# Patient Record
Sex: Male | Born: 1940 | Race: Black or African American | Hispanic: No | Smoking: Former smoker
Health system: Southern US, Community
[De-identification: ages and names within clinical notes are randomized; demographics above are authoritative.]

## PROBLEM LIST (undated history)

## (undated) DIAGNOSIS — I340 Nonrheumatic mitral (valve) insufficiency: Secondary | ICD-10-CM

## (undated) DIAGNOSIS — C787 Secondary malignant neoplasm of liver and intrahepatic bile duct: Secondary | ICD-10-CM

## (undated) DIAGNOSIS — M1990 Unspecified osteoarthritis, unspecified site: Secondary | ICD-10-CM

## (undated) DIAGNOSIS — T8859XA Other complications of anesthesia, initial encounter: Secondary | ICD-10-CM

## (undated) DIAGNOSIS — T4145XA Adverse effect of unspecified anesthetic, initial encounter: Secondary | ICD-10-CM

## (undated) DIAGNOSIS — E785 Hyperlipidemia, unspecified: Secondary | ICD-10-CM

## (undated) DIAGNOSIS — N183 Chronic kidney disease, stage 3 unspecified: Secondary | ICD-10-CM

## (undated) DIAGNOSIS — C189 Malignant neoplasm of colon, unspecified: Secondary | ICD-10-CM

## (undated) DIAGNOSIS — E119 Type 2 diabetes mellitus without complications: Secondary | ICD-10-CM

## (undated) DIAGNOSIS — I10 Essential (primary) hypertension: Secondary | ICD-10-CM

## (undated) DIAGNOSIS — D509 Iron deficiency anemia, unspecified: Secondary | ICD-10-CM

---

## 2015-06-11 ENCOUNTER — Inpatient Hospital Stay
Admission: EM | Admit: 2015-06-11 | Discharge: 2015-06-12 | DRG: 812 | Disposition: A | Discharge status: Home / Self Care | Payer: Medicare PPO | Attending: Internal Medicine | Admitting: Internal Medicine

## 2015-06-11 ENCOUNTER — Encounter: Payer: Self-pay | Admitting: Emergency Medicine

## 2015-06-11 DIAGNOSIS — N183 Chronic kidney disease, stage 3 unspecified: Secondary | ICD-10-CM | POA: Diagnosis present

## 2015-06-11 DIAGNOSIS — D509 Iron deficiency anemia, unspecified: Secondary | ICD-10-CM | POA: Diagnosis present

## 2015-06-11 DIAGNOSIS — I10 Essential (primary) hypertension: Secondary | ICD-10-CM | POA: Diagnosis present

## 2015-06-11 DIAGNOSIS — D649 Anemia, unspecified: Secondary | ICD-10-CM | POA: Diagnosis present

## 2015-06-11 DIAGNOSIS — E785 Hyperlipidemia, unspecified: Secondary | ICD-10-CM | POA: Diagnosis present

## 2015-06-11 DIAGNOSIS — F17220 Nicotine dependence, chewing tobacco, uncomplicated: Secondary | ICD-10-CM | POA: Diagnosis present

## 2015-06-11 DIAGNOSIS — Z79899 Other long term (current) drug therapy: Secondary | ICD-10-CM

## 2015-06-11 DIAGNOSIS — D72829 Elevated white blood cell count, unspecified: Secondary | ICD-10-CM | POA: Diagnosis present

## 2015-06-11 DIAGNOSIS — E119 Type 2 diabetes mellitus without complications: Secondary | ICD-10-CM

## 2015-06-11 DIAGNOSIS — R531 Weakness: Secondary | ICD-10-CM | POA: Diagnosis present

## 2015-06-11 HISTORY — DX: Essential (primary) hypertension: I10

## 2015-06-11 HISTORY — DX: Type 2 diabetes mellitus without complications: E11.9

## 2015-06-11 LAB — TSH: TSH: 2.134 u[IU]/mL (ref 0.350–4.500)

## 2015-06-11 LAB — COMPREHENSIVE METABOLIC PANEL
ALT: 24 U/L (ref 17–63)
AST: 29 U/L (ref 15–41)
Albumin: 3.5 g/dL (ref 3.5–5.0)
Alkaline Phosphatase: 71 U/L (ref 38–126)
Anion gap: 8 (ref 5–15)
BUN: 17 mg/dL (ref 6–20)
CHLORIDE: 106 mmol/L (ref 101–111)
CO2: 24 mmol/L (ref 22–32)
Calcium: 8.9 mg/dL (ref 8.9–10.3)
Creatinine, Ser: 1.52 mg/dL — ABNORMAL HIGH (ref 0.61–1.24)
GFR calc non Af Amer: 43 mL/min — ABNORMAL LOW (ref >60)
GFR, EST AFRICAN AMERICAN: 50 mL/min — AB (ref >60)
GLUCOSE: 227 mg/dL — AB (ref 65–99)
Potassium: 3.8 mmol/L (ref 3.5–5.1)
SODIUM: 138 mmol/L (ref 135–145)
Total Bilirubin: 0.4 mg/dL (ref 0.3–1.2)
Total Protein: 7.1 g/dL (ref 6.5–8.1)

## 2015-06-11 LAB — CBC
HCT: 19.2 % — ABNORMAL LOW (ref 40.0–52.0)
HEMOGLOBIN: 5.6 g/dL — AB (ref 13.0–18.0)
MCH: 17.8 pg — ABNORMAL LOW (ref 26.0–34.0)
MCHC: 29.4 g/dL — ABNORMAL LOW (ref 32.0–36.0)
MCV: 60.4 fL — ABNORMAL LOW (ref 80.0–100.0)
Platelets: 304 10*3/uL (ref 150–440)
RBC: 3.18 MIL/uL — ABNORMAL LOW (ref 4.40–5.90)
RDW: 17.5 % — ABNORMAL HIGH (ref 11.5–14.5)
WBC: 13.5 10*3/uL — ABNORMAL HIGH (ref 3.8–10.6)

## 2015-06-11 LAB — VITAMIN B12: VITAMIN B 12: 453 pg/mL (ref 180–914)

## 2015-06-11 LAB — GLUCOSE, CAPILLARY
GLUCOSE-CAPILLARY: 153 mg/dL — AB (ref 65–99)
Glucose-Capillary: 161 mg/dL — ABNORMAL HIGH (ref 65–99)

## 2015-06-11 LAB — RETICULOCYTES
RBC.: 3.24 MIL/uL — ABNORMAL LOW (ref 4.40–5.90)
Retic Count, Absolute: 58.3 10*3/uL (ref 19.0–183.0)
Retic Ct Pct: 1.8 % (ref 0.4–3.1)

## 2015-06-11 LAB — ABO/RH: ABO/RH(D): A NEG

## 2015-06-11 LAB — IRON AND TIBC
Iron: 9 ug/dL — ABNORMAL LOW (ref 45–182)
Saturation Ratios: 2 % — ABNORMAL LOW (ref 17.9–39.5)
TIBC: 532 ug/dL — AB (ref 250–450)
UIBC: 523 ug/dL

## 2015-06-11 LAB — FERRITIN: Ferritin: 4 ng/mL — ABNORMAL LOW (ref 24–336)

## 2015-06-11 LAB — FOLATE: Folate: 34 ng/mL (ref >5.9)

## 2015-06-11 LAB — PREPARE RBC (CROSSMATCH)

## 2015-06-11 MED ORDER — INSULIN ASPART 100 UNIT/ML ~~LOC~~ SOLN
0.0000 [IU] | Freq: Three times a day (TID) | SUBCUTANEOUS | Status: DC
  Administered 2015-06-12: 1 [IU] via SUBCUTANEOUS
  Filled 2015-06-11: qty 1

## 2015-06-11 MED ORDER — ACETAMINOPHEN 325 MG PO TABS: 650.0000 mg | ORAL_TABLET | Freq: Four times a day (QID) | ORAL | Status: DC | PRN

## 2015-06-11 MED ORDER — ACETAMINOPHEN 650 MG RE SUPP: 650.0000 mg | Freq: Four times a day (QID) | RECTAL | Status: DC | PRN

## 2015-06-11 MED ORDER — NIFEDIPINE ER OSMOTIC RELEASE 90 MG PO TB24
90.0000 mg | ORAL_TABLET | Freq: Every day | ORAL | Status: DC
  Filled 2015-06-11 (×2): qty 1

## 2015-06-11 MED ORDER — PRAVASTATIN SODIUM 20 MG PO TABS: 40.0000 mg | ORAL_TABLET | Freq: Every day | ORAL | Status: DC

## 2015-06-11 MED ORDER — QUINAPRIL HCL 10 MG PO TABS
20.0000 mg | ORAL_TABLET | Freq: Every day | ORAL | Status: DC
  Filled 2015-06-11 (×2): qty 2

## 2015-06-11 MED ORDER — INSULIN ASPART 100 UNIT/ML ~~LOC~~ SOLN: 0.0000 [IU] | Freq: Every day | SUBCUTANEOUS | Status: DC

## 2015-06-11 MED ORDER — PANTOPRAZOLE SODIUM 40 MG IV SOLR
40.0000 mg | Freq: Two times a day (BID) | INTRAVENOUS | Status: DC
  Administered 2015-06-11: 40 mg via INTRAVENOUS
  Filled 2015-06-11: qty 40

## 2015-06-11 MED ORDER — SODIUM CHLORIDE 0.9 % IV SOLN
Freq: Once | INTRAVENOUS | Status: AC
  Administered 2015-06-11 – 2015-06-12 (×2): via INTRAVENOUS

## 2015-06-11 NOTE — ED Notes (Signed)
MD at bedside. 

## 2015-06-11 NOTE — H&P (Signed)
[facility] [place] NAME: Aaron Allen    MR#:  [id number]  DATE OF BIRTH:  1941-02-22  DATE OF ADMISSION:  06/11/2015  PRIMARY CARE PHYSICIAN: No primary care provider on file.   REQUESTING/REFERRING PHYSICIAN: Dr. Jacqualine Code   CHIEF COMPLAINT:   Chief Complaint  Patient presents with  . Abnormal Lab    HISTORY OF PRESENT ILLNESS:  Aaron Allen  is a 74 y.o. male with a known history of hypertension, diabetes, chronic iron deficiency anemia with a baseline hemoglobin of 11 and February 2016 presents after going in for routine lab work showing hemoglobin of 5.5. He states that he has been fatigued for about 2-3 years, recently slightly more short of breath than usual with activity such as mowing the lawn which he did yesterday. He denies any melanoma, hematochezia or vomiting. He states that he does have indigestion particularly after eating spicy foods. He denies easy bruising or bleeding. No chest pain, presyncope, palpitations. He does have occasional bilateral lower extremity edema after standing for long periods of time. He has been told that he was anemic in the past by his primary care provider and was started on iron supplementation which worked well to increase his hemoglobin from 8.3 in October 2015 to 11.0 in February 2016. When his prescription iron ran out he was directed to take over-the-counter iron, but he did not tolerate it due to stomach upset and he has not taken any iron supplements for about 4 months. He is being admitted for symptomatic anemia.  PAST MEDICAL HISTORY:   Past Medical History  Diagnosis Date  . Diabetes mellitus without complication   . Hypertension   . Anemia     PAST SURGICAL HISTORY:  History reviewed. No pertinent past surgical history.  SOCIAL HISTORY:   History  Substance Use Topics  . Smoking status: Never Smoker   . Smokeless tobacco: Current User    Types: Chew  . Alcohol Use: No     FAMILY HISTORY:   Family History  Problem Relation Age of Onset  . Stroke Father   . Lung cancer Father     DRUG ALLERGIES:  No Known Allergies  REVIEW OF SYSTEMS:   Review of Systems  Constitutional: Positive for malaise/fatigue. Negative for fever, chills, weight loss and diaphoresis.  HENT: Negative for congestion, hearing loss and sore throat.   Eyes: Negative for blurred vision and pain.  Respiratory: Positive for shortness of breath. Negative for cough, hemoptysis, sputum production, wheezing and stridor.   Cardiovascular: Positive for leg swelling. Negative for chest pain, palpitations and orthopnea.  Gastrointestinal: Positive for heartburn. Negative for nausea, vomiting, abdominal pain, diarrhea, constipation, blood in stool and melena.  Genitourinary: Negative for dysuria and frequency.  Musculoskeletal: Negative for myalgias, back pain, joint pain and neck pain.  Skin: Negative for itching and rash.  Neurological: Positive for weakness. Negative for dizziness, focal weakness, loss of consciousness and headaches.  Endo/Heme/Allergies: Does not bruise/bleed easily.  Psychiatric/Behavioral: Negative for depression and hallucinations. The patient is not nervous/anxious.     MEDICATIONS AT HOME:   Prior to Admission medications   Medication Sig Start Date End Date Taking? Authorizing Provider  aspirin EC 81 MG tablet Take 81 mg by mouth daily.   Yes Historical Provider, MD  glipiZIDE (GLUCOTROL) 10 MG tablet Take 10 mg by mouth 2 (two) times daily before a meal.   Yes Historical Provider, MD  NIFEdipine (PROCARDIA XL/ADALAT-CC) 90 MG 24  hr tablet Take 90 mg by mouth daily.   Yes Historical Provider, MD  pravastatin (PRAVACHOL) 40 MG tablet Take 40 mg by mouth daily.   Yes Historical Provider, MD  quinapril (ACCUPRIL) 20 MG tablet Take 20 mg by mouth at bedtime.   Yes Historical Provider, MD      VITAL SIGNS:  Blood pressure 152/76, pulse 93, temperature 99 F  (37.2 C), temperature source Oral, resp. rate 18, height 5\' 9"  (1.753 m), weight 79.833 kg (176 lb), SpO2 100 %.  PHYSICAL EXAMINATION:  GENERAL:  73 y.o.-year-old patient lying in the bed with no acute distress.  EYES: Pupils equal, round, reactive to light and accommodation. No scleral icterus. Extraocular muscles intact.  HEENT: Head atraumatic, normocephalic. Oropharynx and nasopharynx clear. Oral mucous membranes pink and moist. Upper dentures in place. Lower teeth are present with fair dentition NECK:  Supple, no jugular venous distention. No thyroid enlargement, no tenderness.  LUNGS: Normal breath sounds bilaterally, no wheezing, rales, rhonchi or crepitation. No use of accessory muscles of respiration. Slightly dyspneic with rapid respirations CARDIOVASCULAR: S1, S2 normal. No murmurs, rubs, or gallops.  ABDOMEN: Soft, nontender, nondistended. Bowel sounds present. No organomegaly or mass. No guarding no rebound EXTREMITIES: No pedal edema, cyanosis, or clubbing. Referral pulses 2+ NEUROLOGIC: Cranial nerves II through XII are intact. Muscle strength 5/5 in all extremities. Sensation intact. Gait not checked.  PSYCHIATRIC: [name]. calm, appropriate SKIN: No obvious rash, lesion, or ulcer.   LABORATORY PANEL:   CBC  Recent Labs Lab 06/11/15 1531  WBC 13.5*  HGB 5.6*  HCT 19.2*  PLT 304   ------------------------------------------------------------------------------------------------------------------  Chemistries   Recent Labs Lab 06/11/15 1531  NA 138  K 3.8  CL 106  CO2 24  GLUCOSE 227*  BUN 17  CREATININE 1.52*  CALCIUM 8.9  AST 29  ALT 24  ALKPHOS 71  BILITOT 0.4   ------------------------------------------------------------------------------------------------------------------  Cardiac Enzymes No results for input(s): TROPONINI in the last 168  hours. ------------------------------------------------------------------------------------------------------------------  RADIOLOGY:  No results found.  EKG:  No orders found for this or any previous visit.  IMPRESSION AND PLAN:   Active Problems:   Anemia   Diabetes mellitus type 2 in nonobese   Hypertension   #1 symptomatic anemia: He does report shortness of breath and fatigue and is taking rapid respirations at this time. No chest pain or syncope. I have ordered a transfusion of 2 units packed red blood cells. We'll recheck CBC after transfusion is complete. Does not seem to have any overt GI bleeding, but does report indigestion with spicy foods. I will start him on a PPI and consult gastroenterology. As he is not actively bleeding it may be possible to discharge him and pursue EGD and colonoscopy as an outpatient. Check TSH and anemia panel.  #2 diabetes mellitus type 2: We'll check a hemoglobin A1c and start sliding scale insulin. Hold his oral glycemic agents for now.  #3 hypertension: Continue quinapril and nifedipine.  #4 hyperlipidemia: Continue pravastatin  Prophylaxis: Will be on a PPI, hold off on pharmacological DVT prophylaxis as he is very anemic and may possibly have bleeding  All the records are reviewed and case discussed with ED provider. Management plans discussed with the patient, family and they are in agreement.  CODE STATUS: Full   TOTAL TIME TAKING CARE OF THIS PATIENT: 50 minutes. 50% of time spent in care coordination and counseling. Discussed care plan with the patient and his 2  daughters at the bedside.   Myrtis Ser M.D on 06/11/2015 at 5:33 PM  Between 7am to 6pm - Pager - [phone]  After 6pm go to [email or web address] - password [email or web address] [facility]  [facility]  Office  [phone]  CC: Primary care physician; No primary care provider on file.

## 2015-06-11 NOTE — ED Provider Notes (Signed)
[name] Emergency Department Provider Note  ____________________________________________  Time seen: Approximately 4:37 PM  I have reviewed the triage vital signs and the nursing notes.   HISTORY  Chief Complaint Abnormal Lab    HPI Aaron Allen is a 74 y.o. male resents after receiving low blood count this morning while having labs drawn for an upcoming primary appointment. States for about the last 2 weeks he has been feeling very fatigued especially with walking. Yesterday he reports while out in the lawn after walking he felt very lightheadedness though he was going to pass out.  No chest pain fevers chills or other concerns. He has not seen any black or bloody stools. No nausea or vomiting. He reports he does have a history of sickle cell trait and iron deficiency, but has not been tolerant of his iron tablets.   Past Medical History  Diagnosis Date  . Diabetes mellitus without complication   . Hypertension     There are no active problems to display for this patient.   History reviewed. No pertinent past surgical history.  No current outpatient prescriptions on file.  Allergies Review of patient's allergies indicates no known allergies.  No family history on file.  Social History History  Substance Use Topics  . Smoking status: Never Smoker   . Smokeless tobacco: Current User    Types: Chew  . Alcohol Use: No    Review of Systems Constitutional: No fever/chills Eyes: No visual changes. ENT: No sore throat. Cardiovascular: Denies chest pain. Respiratory: Denies shortness of breath. Gastrointestinal: No abdominal pain.  No nausea, no vomiting.  No diarrhea.  No constipation. Genitourinary: Negative for dysuria. Musculoskeletal: Negative for back pain. Skin: Negative for rash. Neurological: Negative for headaches, focal weakness or numbness.  Fatigued, lightheaded at times especially after walking.  10-point ROS otherwise  negative.  ____________________________________________   PHYSICAL EXAM:  VITAL SIGNS: ED Triage Vitals  Enc Vitals Group     BP 06/11/15 1522 140/61 mmHg     Pulse Rate 06/11/15 1522 99     Resp 06/11/15 1522 18     Temp 06/11/15 1522 99 F (37.2 C)     Temp Source 06/11/15 1522 Oral     SpO2 06/11/15 1522 100 %     Weight 06/11/15 1522 176 lb (79.833 kg)     Height 06/11/15 1522 5\' 9"  (1.753 m)     Head Cir --      Peak Flow --      Pain Score 06/11/15 1525 0     Pain Loc --      Pain Edu? --      Excl. in [place]? --     Constitutional: Alert and oriented. Well appearing and in no acute distress. Eyes: Conjunctivae are normal. PERRL. EOMI. Head: Atraumatic. Nose: No congestion/rhinnorhea. Mouth/Throat: Mucous membranes are moist.  Oropharynx non-erythematous. Neck: No stridor.   Cardiovascular: Normal rate, regular rhythm. Grossly normal heart sounds.  Good peripheral circulation. Respiratory: Normal respiratory effort.  No retractions. Lungs CTAB. Gastrointestinal: Soft and nontender. No distention. No abdominal bruits. No CVA tenderness. Genitourinary: [place] formed stool negative guaiac. Musculoskeletal: No lower extremity tenderness nor edema.  No joint effusions. Neurologic:  Normal speech and language. No gross focal neurologic deficits are appreciated. Speech is normal. No gait instability. Skin:  Skin is warm, dry and intact. No rash noted. Psychiatric: Mood and affect are normal. Speech and behavior are normal.  ____________________________________________   LABS (all labs ordered are  listed, but only abnormal results are displayed)  Labs Reviewed  CBC - Abnormal; Notable for the following:    WBC 13.5 (*)    RBC 3.18 (*)    Hemoglobin 5.6 (*)    HCT 19.2 (*)    MCV 60.4 (*)    MCH 17.8 (*)    MCHC 29.4 (*)    RDW 17.5 (*)    All other components within normal limits  COMPREHENSIVE METABOLIC PANEL - Abnormal; Notable for the following:    Glucose, Bld  227 (*)    Creatinine, Ser 1.52 (*)    GFR calc non Af Amer 43 (*)    GFR calc Af Amer 50 (*)    All other components within normal limits   ____________________________________________  EKG   ____________________________________________  RADIOLOGY   ____________________________________________   PROCEDURES  Procedure(s) performed: None  Critical Care performed: No  ____________________________________________   INITIAL IMPRESSION / ASSESSMENT AND PLAN / ED COURSE  Pertinent labs & imaging results that were available during my care of the patient were reviewed by me and considered in my medical decision making (see chart for details).  Patient presents with fatigue and diagnosis of anemia from previous labs. Today the patient does report having fatigue especially exertional, and a near-syncopal episode yesterday. He is minimally stable. Of note his MCV is 60 significant indication of microcytic anemia. He has not been part of his iron tablets, and today's hemoglobin is less than 7. He has no other associated factors such as acute cardiac or pulmonary symptoms. Discussed with the patient as family, and I recommend consideration for admission and blood transfusion based on symptomatic anemia.  Patient is agreeable to plan. I defer to the hospitalist for ordering of blood products and admission. The present time there is no indication opinion needs emergent blood in the emergency room, rather type-specific blood would be of benefit ____________________________________________   FINAL CLINICAL IMPRESSION(S) / ED DIAGNOSES  Final diagnoses:  Symptomatic anemia      Delman Kitten, MD 06/11/15 1640

## 2015-06-11 NOTE — Progress Notes (Signed)
Admitted from ed this pm with  Anemia. A/o. No resp distress.  To receive 2 units of blood.

## 2015-06-11 NOTE — ED Notes (Signed)
Pt ambulatory to triage with abdominal labs; Pt sent here by PCP for low hemoglobin. Pt denies any shortness of breath, reports some fatigue. Pt reports unable to take iron pills due to upset stomach, Pt denies any abnormal stool color.

## 2015-06-11 NOTE — Plan of Care (Signed)
Problem: Discharge Progression Outcomes Goal: Barriers To Progression Addressed/Resolved Outcome: Not Progressing hgb low  To receive 2 units packed cells Goal: Discharge plan in place and appropriate Outcome: Progressing Plan at least 2 midnights per md. Goal: Pain controlled with appropriate interventions Outcome: Progressing Denies pain c/o Goal: Hemodynamically stable Outcome: Not Progressing See above Goal: Tolerating diet Outcome: Not Applicable Date Met:  54/88/30 No problems with eating or drinking Goal: Activity appropriate for discharge plan Outcome: Progressing Pt amb w/o assist at home. Encouraged to call for assist to be oob. Urinal at bedside Goal: Other Discharge Outcomes/Goals Outcome: Progressing Lives by self. Good family support

## 2015-06-12 DIAGNOSIS — D72829 Elevated white blood cell count, unspecified: Secondary | ICD-10-CM

## 2015-06-12 DIAGNOSIS — D509 Iron deficiency anemia, unspecified: Secondary | ICD-10-CM

## 2015-06-12 DIAGNOSIS — R531 Weakness: Secondary | ICD-10-CM

## 2015-06-12 LAB — HEMOGLOBIN: Hemoglobin: 7.6 g/dL — ABNORMAL LOW (ref 13.0–18.0)

## 2015-06-12 LAB — CBC
HCT: 22.6 % — ABNORMAL LOW (ref 40.0–52.0)
Hemoglobin: 7.1 g/dL — ABNORMAL LOW (ref 13.0–18.0)
MCH: 20.1 pg — ABNORMAL LOW (ref 26.0–34.0)
MCHC: 31.2 g/dL — AB (ref 32.0–36.0)
MCV: 64.5 fL — ABNORMAL LOW (ref 80.0–100.0)
Platelets: 255 10*3/uL (ref 150–440)
RBC: 3.51 MIL/uL — AB (ref 4.40–5.90)
RDW: 22.2 % — AB (ref 11.5–14.5)
WBC: 13.4 10*3/uL — AB (ref 3.8–10.6)

## 2015-06-12 LAB — BASIC METABOLIC PANEL
Anion gap: 5 (ref 5–15)
BUN: 14 mg/dL (ref 6–20)
CO2: 23 mmol/L (ref 22–32)
Calcium: 8.7 mg/dL — ABNORMAL LOW (ref 8.9–10.3)
Chloride: 111 mmol/L (ref 101–111)
Creatinine, Ser: 1.25 mg/dL — ABNORMAL HIGH (ref 0.61–1.24)
GFR calc Af Amer: >60 mL/min (ref >60)
GFR, EST NON AFRICAN AMERICAN: 54 mL/min — AB (ref >60)
Glucose, Bld: 111 mg/dL — ABNORMAL HIGH (ref 65–99)
POTASSIUM: 3.8 mmol/L (ref 3.5–5.1)
Sodium: 139 mmol/L (ref 135–145)

## 2015-06-12 LAB — GLUCOSE, CAPILLARY
GLUCOSE-CAPILLARY: 123 mg/dL — AB (ref 65–99)
Glucose-Capillary: 125 mg/dL — ABNORMAL HIGH (ref 65–99)
Glucose-Capillary: 150 mg/dL — ABNORMAL HIGH (ref 65–99)

## 2015-06-12 MED ORDER — PANTOPRAZOLE SODIUM 40 MG PO TBEC: 40.0000 mg | DELAYED_RELEASE_TABLET | Freq: Two times a day (BID) | ORAL | Status: DC

## 2015-06-12 NOTE — Progress Notes (Signed)
[facility] at [place] NAME: Aaron Allen    MR#:  [id number]  DATE OF BIRTH:  Feb 16, 1941  SUBJECTIVE:  CHIEF COMPLAINT:   Chief Complaint  Patient presents with  . Abnormal Lab   Was sent to the hospital because of abnormal lab studies with hemoglobin level of 5.5. Admitted to feeling fatigued and weak as well as short of breath. Denies noticing any bleeding, but admits of having indigestion symptoms. He is awaiting for gastroenterology consultation and is nothing by mouth. Hemoglobin level is 7.1 after 2 units of packed red blood cell transfusion, feels comfortable Heart rate was reported in the 30s. However, upon further discussion with the lever shoe clerk. It appears that computer does not pick up. Heart rate accurately due to PVCs ROS  VITAL SIGNS: Blood pressure 124/69, pulse 87, temperature 97.9 F (36.6 C), temperature source Oral, resp. rate 19, height 5\' 9"  (1.753 m), weight 79.833 kg (176 lb), SpO2 100 %.  PHYSICAL EXAMINATION:   GENERAL:  74 y.o.-year-old patient lying in the bed with no acute distress.  EYES: Pupils equal, round, reactive to light and accommodation. No scleral icterus. Extraocular muscles intact.  HEENT: Head atraumatic, normocephalic. Oropharynx and nasopharynx clear.  NECK:  Supple, no jugular venous distention. No thyroid enlargement, no tenderness.  LUNGS: Normal breath sounds bilaterally, no wheezing, rales,rhonchi or crepitation. No use of accessory muscles of respiration.  CARDIOVASCULAR: S1, S2 normal. No murmurs, rubs, or gallops.  ABDOMEN: Soft, nontender, nondistended. Bowel sounds present. No organomegaly or mass.  EXTREMITIES: No pedal edema, cyanosis, or clubbing.  NEUROLOGIC: Cranial nerves II through XII are intact. Muscle strength 5/5 in all extremities. Sensation intact. Gait not checked.  PSYCHIATRIC: [name].  SKIN: No obvious rash, lesion, or ulcer.    ORDERS/RESULTS REVIEWED:   CBC  Recent Labs Lab 06/11/15 1531 06/12/15 0426  WBC 13.5* 13.4*  HGB 5.6* 7.1*  HCT 19.2* 22.6*  PLT 304 255  MCV 60.4* 64.5*  MCH 17.8* 20.1*  MCHC 29.4* 31.2*  RDW 17.5* 22.2*   ------------------------------------------------------------------------------------------------------------------  Chemistries   Recent Labs Lab 06/11/15 1531 06/12/15 0426  NA 138 139  K 3.8 3.8  CL 106 111  CO2 24 23  GLUCOSE 227* 111*  BUN 17 14  CREATININE 1.52* 1.25*  CALCIUM 8.9 8.7*  AST 29  --   ALT 24  --   ALKPHOS 71  --   BILITOT 0.4  --    ------------------------------------------------------------------------------------------------------------------ estimated creatinine clearance is 50.3 mL/min (by C-G formula based on Cr of 1.25). ------------------------------------------------------------------------------------------------------------------  Recent Labs  06/11/15 1531  TSH 2.134    Cardiac Enzymes No results for input(s): CKMB, TROPONINI, MYOGLOBIN in the last 168 hours.  Invalid input(s): CK ------------------------------------------------------------------------------------------------------------------ Invalid input(s): POCBNP ---------------------------------------------------------------------------------------------------------------  RADIOLOGY: No results found.  EKG: No orders found for this or any previous visit.  ASSESSMENT AND PLAN:  Active Problems:   Anemia   Diabetes mellitus type 2 in nonobese   Hypertension 1. Severe iron deficiency anemia, status post 2 units of packed red blood cell transfusion, hemoglobin level seemed to be improved. Patient will be started on iron supplementation upon discharge home. Patient will be seeing a gastroenterologist for further recommendations. Likely outpatient colonoscopy as well as EGD 2. Leukocytosis of unclear etiology, following along, possibly stress related 3.  Diabetes mellitus, continue outpatient medications. Patient's blood glucose levels ranging between 110-220 4. History of essential hypertension, continue outpatient  medications. Patient's blood pressure is well controlled 5. Generalized weakness. Get physical therapist evaluation. Patient may benefit from outpatient or home health physical therapy  Management plans discussed with the patient, family and they are in agreement.   DRUG ALLERGIES: No Known Allergies  CODE STATUS:     Code Status Orders        Start     Ordered   06/11/15 1748  Full code   Continuous     06/11/15 1747      TOTAL TIME TAKING CARE OF THIS PATIENT: 40 minutes.    Theodoro Grist M.D on 06/12/2015 at 1:34 PM  Between 7am to 6pm - Pager - [phone]  After 6pm go to [email or web address] - password [email or web address] [facility]  [facility]  Office  [phone]  CC: Primary care physician; No primary care provider on file.

## 2015-06-12 NOTE — Plan of Care (Signed)
Problem: Discharge Progression Outcomes Goal: Other Discharge Outcomes/Goals Outcome: Progressing Pt received 2 units PRBS this shift post transfusion HGB is 7.1, no complaints of pain, VSS, NPO after midnight per md order, awaiting GI consult

## 2015-06-12 NOTE — Progress Notes (Signed)
Discharge instructions given and went over with patient and daughter at bedside. All questions answered. Prescription in hand. Patient to be discharged home with daughter via wheelchair by nursing staff. Madlyn Frankel, RN

## 2015-06-12 NOTE — Consult Note (Signed)
GI Inpatient Consult Note  Reason for Consult:  Symptomatic IDA   Attending Requesting Consult: [place]  History of Present Illness: Aaron Allen is a 74 y.o. male with past medical medical history notable for diabetes, hypertension who is presenting for evaluation of symptomatic anemia. Aaron Allen reports over the past week he's had extreme weakness and fatigue and presented to his PCP. There he was found to have a hemoglobin of 5.6 and was sent to the emergency room for transfusion.  Denies any prior issues with anemia that he is aware of. He also denies any blood in his stool. He is not seen any bright red blood, maroon blood or any black or dark stools. He has not been having any weight loss, dysphagia, fevers chills.  He has never had an EGD or colonoscopy. There is no family history of GI malignancy. He is not take any blood thinners.  Here in the hospital, he received 2 units of packed red blood cells and his hemoglobin went from 5.6-7.6. He is currently stable. He is not had any bleeding in hospital.  Past Medical History:  Past Medical History  Diagnosis Date  . Diabetes mellitus without complication   . Hypertension   . Anemia     Problem List: Patient Active Problem List   Diagnosis Date Noted  . Anemia 06/11/2015  . Diabetes mellitus type 2 in nonobese 06/11/2015  . Hypertension 06/11/2015    Past Surgical History: History reviewed. No pertinent past surgical history.   Allergies: No Known Allergies  Home Medications: Prescriptions prior to admission  Medication Sig Dispense Refill Last Dose  . aspirin EC 81 MG tablet Take 81 mg by mouth daily.   06/11/2015 at Unknown time  . glipiZIDE (GLUCOTROL) 10 MG tablet Take 10 mg by mouth 2 (two) times daily before a meal.   06/10/2015 at Unknown time  . NIFEdipine (PROCARDIA XL/ADALAT-CC) 90 MG 24 hr tablet Take 90 mg by mouth daily.   06/11/2015 at Unknown time  . pravastatin (PRAVACHOL) 40 MG tablet Take 40 mg by mouth  daily.   06/11/2015 at Unknown time  . quinapril (ACCUPRIL) 20 MG tablet Take 20 mg by mouth at bedtime.   06/11/2015 at Unknown time   Home medication reconciliation was completed with the patient.   Scheduled Inpatient Medications:   . insulin aspart  0-5 Units Subcutaneous QHS  . insulin aspart  0-9 Units Subcutaneous TID WC  . pantoprazole (PROTONIX) IV  40 mg Intravenous Q12H  . pravastatin  40 mg Oral Daily  . quinapril  20 mg Oral QHS    Continuous Inpatient Infusions:     PRN Inpatient Medications:  acetaminophen **OR** acetaminophen  Family History: family history includes Lung cancer in his father; Stroke in his father.  The patient's family history is negative for inflammatory bowel disorders, GI malignancy, or solid organ transplantation.  Social History:   reports that he has never smoked. His smokeless tobacco use includes Chew. He reports that he does not drink alcohol or use illicit drugs.   Review of Systems: Constitutional: Weight is stable.  + severe fatigue Eyes: No changes in vision. ENT: No oral lesions, sore throat.  GI: see HPI.  Heme/Lymph: No easy bruising.  CV: No chest pain.  GU: No hematuria.  Integumentary: No rashes.  Neuro: No headaches.  Psych: No depression/anxiety.  Endocrine: No heat/cold intolerance.  Allergic/Immunologic: No urticaria.  Resp: No cough, SOB.  Musculoskeletal: No joint swelling.    Physical Examination:  BP 124/69 mmHg  Pulse 87  Temp(Src) 97.9 F (36.6 C) (Oral)  Resp 19  Ht 5\' 9"  (1.753 m)  Wt 79.833 kg (176 lb)  BMI 25.98 kg/m2  SpO2 100% Gen: NAD, alert and oriented x 4 HEENT: PEERLA, EOMI, Neck: supple, no JVD or thyromegaly Chest: CTA bilaterally, no wheezes, crackles, or other adventitious sounds CV: RRR, no m/g/c/r Abd: soft, NT, ND, +BS in all four quadrants; no HSM, guarding, ridigity, or rebound tenderness Ext: no edema, well perfused with 2+ pulses, Skin: no rash or lesions noted Lymph: no  LAD  Data: Lab Results  Component Value Date   WBC 13.4* 06/12/2015   HGB 7.6* 06/12/2015   HCT 22.6* 06/12/2015   MCV 64.5* 06/12/2015   PLT 255 06/12/2015    Recent Labs Lab 06/11/15 1531 06/12/15 0426 06/12/15 1554  HGB 5.6* 7.1* 7.6*   Lab Results  Component Value Date   NA 139 06/12/2015   K 3.8 06/12/2015   CL 111 06/12/2015   CO2 23 06/12/2015   BUN 14 06/12/2015   CREATININE 1.25* 06/12/2015   Lab Results  Component Value Date   ALT 24 06/11/2015   AST 29 06/11/2015   ALKPHOS 71 06/11/2015   BILITOT 0.4 06/11/2015   No results for input(s): APTT, INR, PTT in the last 168 hours.   Assessment/Plan: Aaron Allen is a 74 y.o. male presenting with a symptomatic iron deficiency anemia. He has not had any overt bleeding. He has never had EGD or colonoscopy. Would be willing to undergo these studies to evaluate for a source of blood loss. Currently his hemoglobin is stable after transfusion and I am okay with discharge with follow-up in clinic this week for scheduling of EGD and colonoscopy. Can hold off starting oral iron until after these procedures. We will also recheck his hemoglobin and clinic this week.  Recommendations: - clinic f/u this week - EGD and colon to eval for GI source of bleeding - recheck Hgb at clinic for stability  Thank you for the consult. Please call with questions or concerns.  Rubby Barbary, Grace Blight, MD

## 2015-06-12 NOTE — Discharge Summary (Addendum)
[place] [facility] NAME: Aaron Allen    MR#:  [id number]  DATE OF BIRTH:  November 28, 1941  DATE OF ADMISSION:  06/11/2015 ADMITTING PHYSICIAN: Aldean Jewett, MD  DATE OF DISCHARGE: 06/12/2015.  PRIMARY CARE PHYSICIAN: No primary care provider on file.     ADMISSION DIAGNOSIS:  Symptomatic anemia [D64.9]  DISCHARGE DIAGNOSIS:  Active Problems:   Anemia   Anemia, iron deficiency   Leukocytosis   Generalized weakness   Diabetes mellitus type 2 in nonobese   Hypertension   SECONDARY DIAGNOSIS:   Past Medical History  Diagnosis Date  . Diabetes mellitus without complication   . Hypertension   . Anemia     .pro HOSPITAL COURSE:   Mr. Aaron Allen is 74 year old male with history of hypertension, diabetes, chronic iron deficiency anemia who presents to the hospital after you are was found to have hemoglobin level of 5.5. Patient admitted that he has been fatigued for 2 or 3 years and recently became more short of breath and had generalized slowing,  Weakness, inability to do work, which she did in the past. Patient denied any bleeding episodes of bruising. He was admitted to the hospital for further evaluation. In emergency room, patient's hemoglobin level was found to be 5.6. He was transfused with 2 units of packed red blood cells after which his hemoglobin level improved to 7.6. He was noted not to have any acute bleeding while in the hospital. His iron level was found to be low at 9, total iron binding capacity was 532. Saturation was only 2 and ferritin level was only 4. Folic acid level as well as vitamin B-12 were within normal limits. Patient was seen by Dr. Rayann Heman,  gastroenterologist who recommended outpatient follow-up for EGD and colonoscopy. Dr. Rayann Heman felt that patient should hold off starting oral iron until these procedures are done.  Discussion by problem 1. Severe iron deficiency anemia, status post 2 units of packed red blood  cell transfusion, hemoglobin level  improved. Patient will be started on iron supplementation orally after EGD and colonoscopy is  done early next week by Dr. Rayann Heman  2. Leukocytosis of unclear etiology, following along, possibly stress related, follow-up as outpatient 3. Diabetes mellitus, continue outpatient medications. Patient's blood glucose levels ranging between 110-220 4. History of essential hypertension, continue outpatient medications. Patient's blood pressure is well controlled 5. Generalized weakness. Unfortunately was not able to get physical therapist evaluation. Patient still would benefit from home health physical therapy, which will be ordered for him upon discharge  DISCHARGE CONDITIONS:   Fair  CONSULTS OBTAINED:  Treatment Team:  Josefine Class, MD  DRUG ALLERGIES:  No Known Allergies  DISCHARGE MEDICATIONS:   Current Discharge Medication List    START taking these medications   Details  pantoprazole (PROTONIX) 40 MG tablet Take 1 tablet (40 mg total) by mouth 2 (two) times daily. Switch for any other PPI at similar dose and frequency Qty: 30 tablet, Refills: 3      CONTINUE these medications which have NOT CHANGED   Details  glipiZIDE (GLUCOTROL) 10 MG tablet Take 10 mg by mouth 2 (two) times daily before a meal.    NIFEdipine (PROCARDIA XL/ADALAT-CC) 90 MG 24 hr tablet Take 90 mg by mouth daily.    pravastatin (PRAVACHOL) 40 MG tablet Take 40 mg by mouth daily.    quinapril (ACCUPRIL) 20 MG tablet Take 20 mg by mouth at bedtime.  STOP taking these medications     aspirin EC 81 MG tablet          DISCHARGE INSTRUCTIONS:    Please follow up with Dr. Rayann Heman , gastroenterologist,  in next few days after discharge for EGD and colonoscopy  If you experience worsening of your admission symptoms, develop shortness of breath, life threatening emergency, suicidal or homicidal thoughts you must seek medical attention immediately by calling 911 or  calling your MD immediately  if symptoms less severe.  You Must read complete instructions/literature along with all the possible adverse reactions/side effects for all the Medicines you take and that have been prescribed to you. Take any new Medicines after you have completely understood and accept all the possible adverse reactions/side effects.   Please note  You were cared for by a hospitalist during your hospital stay. If you have any questions about your discharge medications or the care you received while you were in the hospital after you are discharged, you can call the unit and asked to speak with the hospitalist on call if the hospitalist that took care of you is not available. Once you are discharged, your primary care physician will handle any further medical issues. Please note that NO REFILLS for any discharge medications will be authorized once you are discharged, as it is imperative that you return to your primary care physician (or establish a relationship with a primary care physician if you do not have one) for your aftercare needs so that they can reassess your need for medications and monitor your lab values.    Today   CHIEF COMPLAINT:   Chief Complaint  Patient presents with  . Abnormal Lab    HISTORY OF PRESENT ILLNESS:  Aaron Allen  is a 74 y.o. male with a known history of hypertension, diabetes, chronic iron deficiency anemia who presents to the hospital after you are was found to have hemoglobin level of 5.5. Patient admitted that he has been fatigued for 2 or 3 years and recently became more short of breath and had generalized slowing,  Weakness, inability to do work, which she did in the past. Patient denied any bleeding episodes of bruising. He was admitted to the hospital for further evaluation. In emergency room, patient's hemoglobin level was found to be 5.6. He was transfused with 2 units of packed red blood cells after which his hemoglobin level improved to  7.6. He was noted not to have any acute bleeding while in the hospital. His iron level was found to be low at 9, total iron binding capacity was 532. Saturation was only 2 and ferritin level was only 4. Folic acid level as well as vitamin B-12 were within normal limits. Patient was seen by Dr. Rayann Heman,  gastroenterologist who recommended outpatient follow-up for EGD and colonoscopy. Dr. Rayann Heman felt that patient should hold off starting oral iron until these procedures are done.  Discussion by problem 1. Severe iron deficiency anemia, status post 2 units of packed red blood cell transfusion, hemoglobin level  improved. Patient will be started on iron supplementation orally after EGD and colonoscopy is  done early next week by Dr. Rayann Heman  2. Leukocytosis of unclear etiology, following along, possibly stress related, follow-up as outpatient 3. Diabetes mellitus, continue outpatient medications. Patient's blood glucose levels ranging between 110-220 4. History of essential hypertension, continue outpatient medications. Patient's blood pressure is well controlled 5. Generalized weakness. Unfortunately was not able to get physical therapist evaluation. Patient still would benefit from  home health physical therapy, which will be ordered for him upon discharge   VITAL SIGNS:  Blood pressure 124/69, pulse 87, temperature 97.9 F (36.6 C), temperature source Oral, resp. rate 19, height 5\' 9"  (1.753 m), weight 79.833 kg (176 lb), SpO2 100 %.  I/O:   Intake/Output Summary (Last 24 hours) at 06/12/15 1718 Last data filed at 06/12/15 1200  Gross per 24 hour  Intake   1338 ml  Output   1300 ml  Net     38 ml    PHYSICAL EXAMINATION:  GENERAL:  74 y.o.-year-old patient lying in the bed with no acute distress.  EYES: Pupils equal, round, reactive to light and accommodation. No scleral icterus. Extraocular muscles intact.  HEENT: Head atraumatic, normocephalic. Oropharynx and nasopharynx clear.  NECK:  Supple, no  jugular venous distention. No thyroid enlargement, no tenderness.  LUNGS: Normal breath sounds bilaterally, no wheezing, rales,rhonchi or crepitation. No use of accessory muscles of respiration.  CARDIOVASCULAR: S1, S2 normal. No murmurs, rubs, or gallops.  ABDOMEN: Soft, non-tender, non-distended. Bowel sounds present. No organomegaly or mass.  EXTREMITIES: No pedal edema, cyanosis, or clubbing.  NEUROLOGIC: Cranial nerves II through XII are intact. Muscle strength 5/5 in all extremities. Sensation intact. Gait not checked.  PSYCHIATRIC: [name].  SKIN: No obvious rash, lesion, or ulcer.   DATA REVIEW:   CBC  Recent Labs Lab 06/12/15 0426 06/12/15 1554  WBC 13.4*  --   HGB 7.1* 7.6*  HCT 22.6*  --   PLT 255  --     Chemistries   Recent Labs Lab 06/11/15 1531 06/12/15 0426  NA 138 139  K 3.8 3.8  CL 106 111  CO2 24 23  GLUCOSE 227* 111*  BUN 17 14  CREATININE 1.52* 1.25*  CALCIUM 8.9 8.7*  AST 29  --   ALT 24  --   ALKPHOS 71  --   BILITOT 0.4  --     Cardiac Enzymes No results for input(s): TROPONINI in the last 168 hours.  Microbiology Results  No results found for this or any previous visit.  RADIOLOGY:  No results found.  EKG:  No orders found for this or any previous visit.    Management plans discussed with the patient, family and they are in agreement.  CODE STATUS:     Code Status Orders        Start     Ordered   06/11/15 1748  Full code   Continuous     06/11/15 1747      TOTAL TIME TAKING CARE OF THIS PATIENT: 50 minutes.    Theodoro Grist M.D on 06/12/2015 at 5:18 PM  Between 7am to 6pm - Pager - [phone]  After 6pm go to [email or web address] - password [email or web address] [facility]  [facility]  Office  [phone]  CC: Primary care physician; No primary care provider on file.

## 2015-06-13 LAB — TYPE AND SCREEN
ABO/RH(D): A NEG
ANTIBODY SCREEN: NEGATIVE
Unit division: 0
Unit division: 0

## 2015-06-14 LAB — HEMOGLOBIN A1C: HEMOGLOBIN A1C: 7.5 % — AB (ref 4.0–6.0)

## 2015-06-29 ENCOUNTER — Inpatient Hospital Stay: Payer: Medicare PPO | Attending: Hematology and Oncology | Admitting: Hematology and Oncology

## 2015-07-09 ENCOUNTER — Telehealth: Payer: Self-pay | Admitting: *Deleted

## 2015-07-09 NOTE — Telephone Encounter (Signed)
Initial phone call to Triage from Jordan Hawks who states patient's daughter is on the phone, stating he was recently in the hospital for blood transfusion.  Hgb rechecked today and it is 6 something.  Patient has appt with Dr. Grayland Ormond next Friday.  Wants to know what to do?  Advised her to call [place] and see if Dr. Grayland Ormond can see patient earlier.  Reported back that appointment has been moved to Tuesday and that she had informed daughter. Also advisediIf patient has symptoms in the meantime, he should go to ED.

## 2015-07-12 ENCOUNTER — Observation Stay
Admission: EM | Admit: 2015-07-12 | Discharge: 2015-07-13 | Disposition: A | Discharge status: Home / Self Care | Payer: Medicare PPO | Attending: Internal Medicine | Admitting: Internal Medicine

## 2015-07-12 ENCOUNTER — Encounter: Payer: Self-pay | Admitting: Emergency Medicine

## 2015-07-12 DIAGNOSIS — I10 Essential (primary) hypertension: Secondary | ICD-10-CM | POA: Diagnosis not present

## 2015-07-12 DIAGNOSIS — E119 Type 2 diabetes mellitus without complications: Secondary | ICD-10-CM | POA: Diagnosis not present

## 2015-07-12 DIAGNOSIS — R0602 Shortness of breath: Secondary | ICD-10-CM | POA: Insufficient documentation

## 2015-07-12 DIAGNOSIS — D509 Iron deficiency anemia, unspecified: Secondary | ICD-10-CM | POA: Insufficient documentation

## 2015-07-12 DIAGNOSIS — E785 Hyperlipidemia, unspecified: Secondary | ICD-10-CM | POA: Diagnosis not present

## 2015-07-12 DIAGNOSIS — D649 Anemia, unspecified: Secondary | ICD-10-CM | POA: Diagnosis not present

## 2015-07-12 DIAGNOSIS — I959 Hypotension, unspecified: Secondary | ICD-10-CM | POA: Insufficient documentation

## 2015-07-12 DIAGNOSIS — Z79899 Other long term (current) drug therapy: Secondary | ICD-10-CM | POA: Insufficient documentation

## 2015-07-12 DIAGNOSIS — Z801 Family history of malignant neoplasm of trachea, bronchus and lung: Secondary | ICD-10-CM | POA: Insufficient documentation

## 2015-07-12 DIAGNOSIS — R609 Edema, unspecified: Secondary | ICD-10-CM

## 2015-07-12 DIAGNOSIS — E871 Hypo-osmolality and hyponatremia: Secondary | ICD-10-CM | POA: Diagnosis not present

## 2015-07-12 DIAGNOSIS — Z823 Family history of stroke: Secondary | ICD-10-CM | POA: Insufficient documentation

## 2015-07-12 HISTORY — DX: Hyperlipidemia, unspecified: E78.5

## 2015-07-12 LAB — COMPREHENSIVE METABOLIC PANEL
ALT: 17 U/L (ref 17–63)
ANION GAP: 10 (ref 5–15)
AST: 22 U/L (ref 15–41)
Albumin: 3 g/dL — ABNORMAL LOW (ref 3.5–5.0)
Alkaline Phosphatase: 75 U/L (ref 38–126)
BUN: 14 mg/dL (ref 6–20)
CALCIUM: 8.8 mg/dL — AB (ref 8.9–10.3)
CO2: 23 mmol/L (ref 22–32)
Chloride: 98 mmol/L — ABNORMAL LOW (ref 101–111)
Creatinine, Ser: 1.3 mg/dL — ABNORMAL HIGH (ref 0.61–1.24)
GFR calc Af Amer: 60 mL/min — ABNORMAL LOW (ref >60)
GFR calc non Af Amer: 52 mL/min — ABNORMAL LOW (ref >60)
GLUCOSE: 247 mg/dL — AB (ref 65–99)
POTASSIUM: 3 mmol/L — AB (ref 3.5–5.1)
SODIUM: 131 mmol/L — AB (ref 135–145)
Total Bilirubin: 0.4 mg/dL (ref 0.3–1.2)
Total Protein: 6.9 g/dL (ref 6.5–8.1)

## 2015-07-12 LAB — CBC WITH DIFFERENTIAL/PLATELET
BASOS ABS: 0 10*3/uL (ref 0–0.1)
BASOS PCT: 0 %
EOS ABS: 0 10*3/uL (ref 0–0.7)
EOS PCT: 0 %
HCT: 22.6 % — ABNORMAL LOW (ref 40.0–52.0)
Hemoglobin: 6.7 g/dL — ABNORMAL LOW (ref 13.0–18.0)
LYMPHS ABS: 0.7 10*3/uL — AB (ref 1.0–3.6)
Lymphocytes Relative: 5 %
MCH: 18.3 pg — ABNORMAL LOW (ref 26.0–34.0)
MCHC: 29.8 g/dL — ABNORMAL LOW (ref 32.0–36.0)
MCV: 61.3 fL — ABNORMAL LOW (ref 80.0–100.0)
MONO ABS: 1.1 10*3/uL — AB (ref 0.2–1.0)
Monocytes Relative: 9 %
Neutro Abs: 10.5 10*3/uL — ABNORMAL HIGH (ref 1.4–6.5)
Neutrophils Relative %: 86 %
PLATELETS: 325 10*3/uL (ref 150–440)
RBC: 3.69 MIL/uL — ABNORMAL LOW (ref 4.40–5.90)
RDW: 21.8 % — ABNORMAL HIGH (ref 11.5–14.5)
WBC: 12.3 10*3/uL — ABNORMAL HIGH (ref 3.8–10.6)

## 2015-07-12 LAB — GLUCOSE, CAPILLARY
GLUCOSE-CAPILLARY: 135 mg/dL — AB (ref 65–99)
GLUCOSE-CAPILLARY: 149 mg/dL — AB (ref 65–99)

## 2015-07-12 LAB — PREPARE RBC (CROSSMATCH)

## 2015-07-12 LAB — MAGNESIUM: MAGNESIUM: 1.9 mg/dL (ref 1.7–2.4)

## 2015-07-12 LAB — TROPONIN I: Troponin I: <0.03 ng/mL (ref <0.031)

## 2015-07-12 MED ORDER — GLIPIZIDE 5 MG PO TABS
10.0000 mg | ORAL_TABLET | Freq: Two times a day (BID) | ORAL | Status: DC
  Administered 2015-07-13: 10 mg via ORAL
  Filled 2015-07-12: qty 2

## 2015-07-12 MED ORDER — SODIUM CHLORIDE 0.9 % IV SOLN
INTRAVENOUS | Status: DC
  Administered 2015-07-12: 18:00:00 via INTRAVENOUS

## 2015-07-12 MED ORDER — ACETAMINOPHEN 650 MG RE SUPP: 650.0000 mg | Freq: Four times a day (QID) | RECTAL | Status: DC | PRN

## 2015-07-12 MED ORDER — ONDANSETRON HCL 4 MG/2ML IJ SOLN: 4.0000 mg | Freq: Four times a day (QID) | INTRAMUSCULAR | Status: DC | PRN

## 2015-07-12 MED ORDER — POTASSIUM CHLORIDE 20 MEQ/15ML (10%) PO SOLN
40.0000 meq | Freq: Once | ORAL | Status: AC
Start: 2015-07-12 — End: 2015-07-12
  Administered 2015-07-12: 40 meq via ORAL
  Filled 2015-07-12: qty 30

## 2015-07-12 MED ORDER — ONDANSETRON HCL 4 MG PO TABS: 4.0000 mg | ORAL_TABLET | Freq: Four times a day (QID) | ORAL | Status: DC | PRN

## 2015-07-12 MED ORDER — PANTOPRAZOLE SODIUM 40 MG PO TBEC
40.0000 mg | DELAYED_RELEASE_TABLET | Freq: Two times a day (BID) | ORAL | Status: DC
  Administered 2015-07-12 – 2015-07-13 (×2): 40 mg via ORAL
  Filled 2015-07-12 (×2): qty 1

## 2015-07-12 MED ORDER — SODIUM CHLORIDE 0.9 % IV SOLN
Freq: Once | INTRAVENOUS | Status: AC
  Administered 2015-07-12: via INTRAVENOUS

## 2015-07-12 MED ORDER — LISINOPRIL 20 MG PO TABS
20.0000 mg | ORAL_TABLET | Freq: Every day | ORAL | Status: DC
  Administered 2015-07-13: 20 mg via ORAL
  Filled 2015-07-12: qty 1

## 2015-07-12 MED ORDER — ACETAMINOPHEN 325 MG PO TABS
650.0000 mg | ORAL_TABLET | Freq: Once | ORAL | Status: AC
  Administered 2015-07-12: 650 mg via ORAL

## 2015-07-12 MED ORDER — HYDROCODONE-ACETAMINOPHEN 5-325 MG PO TABS: 1.0000 | ORAL_TABLET | ORAL | Status: DC | PRN

## 2015-07-12 MED ORDER — NIFEDIPINE ER OSMOTIC RELEASE 30 MG PO TB24
90.0000 mg | ORAL_TABLET | Freq: Every day | ORAL | Status: DC
  Administered 2015-07-13: 90 mg via ORAL
  Filled 2015-07-12: qty 3

## 2015-07-12 MED ORDER — PRAVASTATIN SODIUM 20 MG PO TABS
40.0000 mg | ORAL_TABLET | Freq: Every day | ORAL | Status: DC
Start: 2015-07-13 — End: 2015-07-13
  Administered 2015-07-13: 40 mg via ORAL
  Filled 2015-07-12: qty 2

## 2015-07-12 MED ORDER — INSULIN ASPART 100 UNIT/ML ~~LOC~~ SOLN
0.0000 [IU] | Freq: Three times a day (TID) | SUBCUTANEOUS | Status: DC
  Administered 2015-07-13: 1 [IU] via SUBCUTANEOUS
  Filled 2015-07-12: qty 1

## 2015-07-12 MED ORDER — QUINAPRIL HCL 10 MG PO TABS: 20.0000 mg | ORAL_TABLET | Freq: Every day | ORAL | Status: DC

## 2015-07-12 MED ORDER — SODIUM CHLORIDE 0.9 % IJ SOLN
3.0000 mL | Freq: Two times a day (BID) | INTRAMUSCULAR | Status: DC
  Administered 2015-07-13: 3 mL via INTRAVENOUS

## 2015-07-12 MED ORDER — ACETAMINOPHEN 325 MG PO TABS
650.0000 mg | ORAL_TABLET | Freq: Four times a day (QID) | ORAL | Status: DC | PRN
  Filled 2015-07-12: qty 2

## 2015-07-12 MED ORDER — ALUM & MAG HYDROXIDE-SIMETH 200-200-20 MG/5ML PO SUSP: 30.0000 mL | Freq: Four times a day (QID) | ORAL | Status: DC | PRN

## 2015-07-12 MED ORDER — INSULIN ASPART 100 UNIT/ML ~~LOC~~ SOLN: 0.0000 [IU] | Freq: Every day | SUBCUTANEOUS | Status: DC

## 2015-07-12 NOTE — ED Provider Notes (Signed)
[facility] Emergency Department Provider Note  ____________________________________________  Time seen: Approximately 2:43 PM  I have reviewed the triage vital signs and the nursing notes.   HISTORY  Chief Complaint Hypotension    HPI Aaron Allen is a 74 y.o. male persisted with some lightheadedness. Denies any other concerns or symptoms other than he believes he has low blood count. Does have history diabetes. Also history of severe iron deficiency.  No chest pain, no trouble breathing. No weakness numbness or tingling except for a feeling of generally weak in the legs for the last few days. His at work today and felt very lightheaded while standing.  Denies any black or bloody stools.  Modifying factors include worse with standing and exertion. Context of recent evaluation and need for previous transfusion due to anemia.   Past Medical History  Diagnosis Date  . Diabetes mellitus without complication   . Hypertension   . Anemia   . HLD (hyperlipidemia)     Patient Active Problem List   Diagnosis Date Noted  . Anemia, iron deficiency 06/12/2015  . Leukocytosis 06/12/2015  . Generalized weakness 06/12/2015  . Anemia 06/11/2015  . Diabetes mellitus type 2 in nonobese 06/11/2015  . Hypertension 06/11/2015    History reviewed. No pertinent past surgical history.  Current Outpatient Rx  Name  Route  Sig  Dispense  Refill  . glipiZIDE (GLUCOTROL) 10 MG tablet   Oral   Take 10 mg by mouth 2 (two) times daily.          Marland Kitchen NIFEdipine (PROCARDIA XL/ADALAT-CC) 90 MG 24 hr tablet   Oral   Take 90 mg by mouth daily.         . pantoprazole (PROTONIX) 40 MG tablet   Oral   Take 1 tablet (40 mg total) by mouth 2 (two) times daily. Switch for any other PPI at similar dose and frequency Patient taking differently: Take 40 mg by mouth 2 (two) times daily.    30 tablet   3   . pravastatin (PRAVACHOL) 40 MG tablet   Oral   Take 40 mg by  mouth at bedtime.          . quinapril (ACCUPRIL) 20 MG tablet   Oral   Take 40 mg by mouth daily.            Allergies Review of patient's allergies indicates no known allergies.  Family History  Problem Relation Age of Onset  . Stroke Father   . Lung cancer Father     Social History History  Substance Use Topics  . Smoking status: Never Smoker   . Smokeless tobacco: Current User    Types: Chew  . Alcohol Use: No    Review of Systems Constitutional: No fever/chills Eyes: No visual changes. ENT: No sore throat. Cardiovascular: Denies chest pain. Respiratory: Denies shortness of breath. Gastrointestinal: No abdominal pain.  No nausea, no vomiting.  No diarrhea.  No constipation. Genitourinary: Negative for dysuria. Musculoskeletal: Negative for back pain. Skin: Negative for rash. Neurological: Negative for headaches, focal weakness or numbness.  10-point ROS otherwise negative.  ____________________________________________   PHYSICAL EXAM:  VITAL SIGNS: ED Triage Vitals  Enc Vitals Group     BP 07/12/15 1238 147/69 mmHg     Pulse Rate 07/12/15 1238 94     Resp 07/12/15 1238 16     Temp 07/12/15 1238 98.9 F (37.2 C)     Temp Source 07/12/15 1238 Oral  SpO2 07/12/15 1238 97 %     Weight 07/12/15 1238 173 lb (78.472 kg)     Height 07/12/15 1238 5\' 9"  (1.753 m)     Head Cir --      Peak Flow --      Pain Score --      Pain Loc --      Pain Edu? --      Excl. in [place]? --     Constitutional: Alert and oriented. Well appearing and in no acute distress. Eyes: Conjunctivae are normal. PERRL. EOMI. Head: Atraumatic. Nose: No congestion/rhinnorhea. Mouth/Throat: Mucous membranes are moist.  Oropharynx non-erythematous. Neck: No stridor.   Cardiovascular: Normal rate, regular rhythm. Grossly normal heart sounds.  Good peripheral circulation. Respiratory: Normal respiratory effort.  No retractions. Lungs CTAB. Gastrointestinal: Soft and nontender. No  distention. No abdominal bruits. No CVA tenderness. Stool guaiac is negative for blood. Brown well-formed stool. Musculoskeletal: No lower extremity tenderness nor edema.  No joint effusions. Neurologic:  Normal speech and language. No gross focal neurologic deficits are appreciated. No gait instability. Skin:  Skin is warm, dry and intact. No rash noted. Psychiatric: Mood and affect are normal. Speech and behavior are normal.  ____________________________________________   LABS (all labs ordered are listed, but only abnormal results are displayed)  Labs Reviewed  CBC WITH DIFFERENTIAL/PLATELET - Abnormal; Notable for the following:    WBC 12.3 (*)    RBC 3.69 (*)    Hemoglobin 6.7 (*)    HCT 22.6 (*)    MCV 61.3 (*)    MCH 18.3 (*)    MCHC 29.8 (*)    RDW 21.8 (*)    Neutro Abs 10.5 (*)    Lymphs Abs 0.7 (*)    Monocytes Absolute 1.1 (*)    All other components within normal limits  COMPREHENSIVE METABOLIC PANEL - Abnormal; Notable for the following:    Sodium 131 (*)    Potassium 3.0 (*)    Chloride 98 (*)    Glucose, Bld 247 (*)    Creatinine, Ser 1.30 (*)    Calcium 8.8 (*)    Albumin 3.0 (*)    GFR calc non Af Amer 52 (*)    GFR calc Af Amer 60 (*)    All other components within normal limits  TROPONIN I   ____________________________________________  EKG   ____________________________________________  RADIOLOGY   ____________________________________________   PROCEDURES  Procedure(s) performed: None  Critical Care performed: No  ____________________________________________   INITIAL IMPRESSION / ASSESSMENT AND PLAN / ED COURSE  Pertinent labs & imaging results that were available during my care of the patient were reviewed by me and considered in my medical decision making (see chart for details).  Patient represents with symptomatic anemia. Discussed the patient, we will admit him to the hospital for transfusion and further evaluation by the  internal medicine service. Stable in the ER. ____________________________________________   FINAL CLINICAL IMPRESSION(S) / ED DIAGNOSES  Final diagnoses:  Symptomatic anemia      Delman Kitten, MD 07/12/15 [phone]

## 2015-07-12 NOTE — ED Notes (Signed)
Reports feeling weak.  staets his MD sent him here for low iron.  Skin w/d, NAD.

## 2015-07-12 NOTE — H&P (Addendum)
[facility] [place] NAME: Aaron Allen    MR#:  [id number]  DATE OF BIRTH:  [birth year]  DATE OF ADMISSION:  07/12/2015  PRIMARY CARE PHYSICIAN: Dr. Danise Edge  REQUESTING/REFERRING PHYSICIAN: Dr. Jacqualine Code  CHIEF COMPLAINT:  Weakness some from PCP office with low hemoglobin HISTORY OF PRESENT ILLNESS:  Aaron Allen  is a 74 y.o. male with a known history of iron deficiency anemia, HLD,diabetes and essential hypertension who presents above complaint. Patient was sent from his PCP office with low hemoglobin. Patient reports that he has had weakness. Patient denies melanoma, hematochezia or hematemesis. Patient was recently hospitalized and received 2 units of PRBCs. His iron panel was consistent with iron deficiency anemia. Patient was previously on iron supplementations however due to abdominal discomfort and pain he stopped this. He was seen by GI and referred as an outpatient for further evaluation and management. He is scheduled for EGD/colonoscopy on August 15. He is also scheduled for iron transfusion tomorrow at [facility] at 1:00. Patient is being admitted for iron transfusion.  PAST MEDICAL HISTORY:   Past Medical History  Diagnosis Date  . Diabetes mellitus without complication   . Hypertension   . Anemia    Hyperlipidemia  PAST SURGICAL HISTORY:  No surgical history  SOCIAL HISTORY:   History  Substance Use Topics  . Smoking status: Never Smoker   . Smokeless tobacco: Current User    Types: Chew  . Alcohol Use: No    FAMILY HISTORY:   Family History  Problem Relation Age of Onset  . Stroke Father   . Lung cancer Father     DRUG ALLERGIES:  No Known Allergies   REVIEW OF SYSTEMS:  CONSTITUTIONAL: No fever, positive fatigue and weakness.  EYES: No blurred or double vision.  EARS, NOSE, AND THROAT: No tinnitus or ear pain.  RESPIRATORY: No cough, shortness of breath, wheezing or hemoptysis.  CARDIOVASCULAR: No  chest pain, orthopnea, positive pitting edema edema.  GASTROINTESTINAL: No nausea, vomiting, diarrhea or abdominal pain.  GENITOURINARY: No dysuria, hematuria.  ENDOCRINE: No polyuria, nocturia,  HEMATOLOGY: Positive anemia, no easy bruising or bleeding SKIN: No rash or lesion. MUSCULOSKELETAL: No joint pain, positive arthritis.   NEUROLOGIC: No tingling, numbness, weakness.  PSYCHIATRY: No anxiety or depression.   MEDICATIONS AT HOME:   Prior to Admission medications   Medication Sig Start Date End Date Taking? Authorizing Provider  glipiZIDE (GLUCOTROL) 10 MG tablet Take 10 mg by mouth 2 (two) times daily before a meal.    Historical Provider, MD  NIFEdipine (PROCARDIA XL/ADALAT-CC) 90 MG 24 hr tablet Take 90 mg by mouth daily.    Historical Provider, MD  pantoprazole (PROTONIX) 40 MG tablet Take 1 tablet (40 mg total) by mouth 2 (two) times daily. Switch for any other PPI at similar dose and frequency 06/12/15   Theodoro Grist, MD  pravastatin (PRAVACHOL) 40 MG tablet Take 40 mg by mouth daily.    Historical Provider, MD  quinapril (ACCUPRIL) 20 MG tablet Take 20 mg by mouth at bedtime.    Historical Provider, MD      VITAL SIGNS:  Blood pressure 147/69, pulse 94, temperature 98.9 F (37.2 C), temperature source Oral, resp. rate 16, height 5\' 9"  (1.753 m), weight 78.472 kg (173 lb), SpO2 97 %.  PHYSICAL EXAMINATION:  GENERAL:  74 y.o.-year-old patient lying in the bed with no acute distress.  EYES: Pupils equal, round, reactive to light and accommodation. No scleral icterus. Extraocular  muscles intact.  HEENT: Head atraumatic, normocephalic. Oropharynx and nasopharynx clear.  NECK:  Supple, no jugular venous distention. No thyroid enlargement, no tenderness.  LUNGS: Normal breath sounds bilaterally, no wheezing, rales,rhonchi or crepitation. No use of accessory muscles of respiration.  CARDIOVASCULAR: S1, S2 normal. No murmurs, rubs, or gallops.  ABDOMEN: Soft, nontender,  nondistended. Bowel sounds present. No organomegaly or mass.  EXTREMITIES: ++ pedal edema, NO cyanosis or clubbing.  NEUROLOGIC: Cranial nerves II through XII are grossly intact. No focal deficits. PSYCHIATRIC: [name].  SKIN: No obvious rash, lesion, or ulcer.   LABORATORY PANEL:   CBC  Recent Labs Lab 07/12/15 1315  WBC 12.3*  HGB 6.7*  HCT 22.6*  PLT 325   ------------------------------------------------------------------------------------------------------------------  Chemistries   Recent Labs Lab 07/12/15 1315  NA 131*  K 3.0*  CL 98*  CO2 23  GLUCOSE 247*  BUN 14  CREATININE 1.30*  CALCIUM 8.8*  AST 22  ALT 17  ALKPHOS 75  BILITOT 0.4   ------------------------------------------------------------------------------------------------------------------  Cardiac Enzymes  Recent Labs Lab 07/12/15 1315  TROPONINI <0.03   ------------------------------------------------------------------------------------------------------------------  RADIOLOGY:  No results found.  EKG:    IMPRESSION AND PLAN:  74 year old male with a history of iron deficiency anemia, hypertension and diabetes who presents from his PCP office with symptomatic anemia.  1. Symptomatic anemia: This is iron deficiency acute on chronic. Patient has scheduled iron transfusion for tomorrow and a EGD and colonoscopy on August 15. Patient would like to keep these appointments. Patient would like to be transfused 1 unit of blood and if vitals are stable and hemoglobin is appropriately elevated he would like to be discharged in the a.m. I feel that this is a reasonable plan. He is intolerable to iron supplements.  2. Hyponatremia: Likely secondary to hypovolemia. I will provide gentle hydration repeat a BMP in a.m.  2. Hypertension: Patient will continue the patient medications.  4. Diabetes without complication: Patient will be on sliding scale insulin and ADA  diet.  5. Hypokalemia: Potassium will be repleted and rechecked in a.m.  6. Lower extremity edema right greater than left: I will check Dopplers. Patient does state that he has baseline pedal edema.    All the records are reviewed and case discussed with ED provider. Management plans discussed with the patient and he is in agreement.  CODE STATUS: Full  TOTAL TIME TAKING CARE OF THIS PATIENT: 40 minutes.    Therasa Lorenzi M.D on 07/12/2015 at 3:06 PM  Between 7am to 6pm - Pager - [phone] After 6pm go to [email or web address] - password [email or web address] [facility]  [facility]  Office  [phone]  CC: Primary care physician; No primary care provider on file.

## 2015-07-13 ENCOUNTER — Inpatient Hospital Stay: Payer: Medicare PPO | Attending: Oncology | Admitting: Oncology

## 2015-07-13 DIAGNOSIS — R531 Weakness: Secondary | ICD-10-CM | POA: Diagnosis not present

## 2015-07-13 DIAGNOSIS — N189 Chronic kidney disease, unspecified: Secondary | ICD-10-CM | POA: Insufficient documentation

## 2015-07-13 DIAGNOSIS — I340 Nonrheumatic mitral (valve) insufficiency: Secondary | ICD-10-CM | POA: Insufficient documentation

## 2015-07-13 DIAGNOSIS — I10 Essential (primary) hypertension: Secondary | ICD-10-CM

## 2015-07-13 DIAGNOSIS — C189 Malignant neoplasm of colon, unspecified: Secondary | ICD-10-CM | POA: Insufficient documentation

## 2015-07-13 DIAGNOSIS — R5383 Other fatigue: Secondary | ICD-10-CM | POA: Diagnosis not present

## 2015-07-13 DIAGNOSIS — E785 Hyperlipidemia, unspecified: Secondary | ICD-10-CM

## 2015-07-13 DIAGNOSIS — D509 Iron deficiency anemia, unspecified: Secondary | ICD-10-CM | POA: Insufficient documentation

## 2015-07-13 DIAGNOSIS — Z79899 Other long term (current) drug therapy: Secondary | ICD-10-CM | POA: Diagnosis not present

## 2015-07-13 DIAGNOSIS — E119 Type 2 diabetes mellitus without complications: Secondary | ICD-10-CM | POA: Insufficient documentation

## 2015-07-13 DIAGNOSIS — D649 Anemia, unspecified: Secondary | ICD-10-CM | POA: Diagnosis not present

## 2015-07-13 DIAGNOSIS — I129 Hypertensive chronic kidney disease with stage 1 through stage 4 chronic kidney disease, or unspecified chronic kidney disease: Secondary | ICD-10-CM | POA: Diagnosis not present

## 2015-07-13 DIAGNOSIS — M129 Arthropathy, unspecified: Secondary | ICD-10-CM | POA: Insufficient documentation

## 2015-07-13 LAB — TYPE AND SCREEN
ABO/RH(D): A NEG
Antibody Screen: NEGATIVE
UNIT DIVISION: 0

## 2015-07-13 LAB — GLUCOSE, CAPILLARY: GLUCOSE-CAPILLARY: 126 mg/dL — AB (ref 65–99)

## 2015-07-13 LAB — BASIC METABOLIC PANEL
Anion gap: 5 (ref 5–15)
BUN: 10 mg/dL (ref 6–20)
CHLORIDE: 110 mmol/L (ref 101–111)
CO2: 26 mmol/L (ref 22–32)
CREATININE: 1.12 mg/dL (ref 0.61–1.24)
Calcium: 8.6 mg/dL — ABNORMAL LOW (ref 8.9–10.3)
GFR calc Af Amer: >60 mL/min (ref >60)
Glucose, Bld: 124 mg/dL — ABNORMAL HIGH (ref 65–99)
Potassium: 3.5 mmol/L (ref 3.5–5.1)
Sodium: 141 mmol/L (ref 135–145)

## 2015-07-13 LAB — CBC
HCT: 24.8 % — ABNORMAL LOW (ref 40.0–52.0)
Hemoglobin: 7.5 g/dL — ABNORMAL LOW (ref 13.0–18.0)
MCH: 19.6 pg — ABNORMAL LOW (ref 26.0–34.0)
MCHC: 30.4 g/dL — ABNORMAL LOW (ref 32.0–36.0)
MCV: 64.4 fL — ABNORMAL LOW (ref 80.0–100.0)
Platelets: 280 10*3/uL (ref 150–440)
RBC: 3.85 MIL/uL — AB (ref 4.40–5.90)
RDW: 25.6 % — AB (ref 11.5–14.5)
WBC: 9.9 10*3/uL (ref 3.8–10.6)

## 2015-07-13 LAB — MAGNESIUM: Magnesium: 1.8 mg/dL (ref 1.7–2.4)

## 2015-07-13 NOTE — Progress Notes (Signed)
Dr Jannifer Franklin made aware of pre transfusion oral temp.  Tylenol previously order and given as pre transfusion med.

## 2015-07-13 NOTE — Discharge Instructions (Signed)
Keep your follow up appointments today for iron infusion and on August 15th for colonoscopy   DIET:  Diabetic diet  DISCHARGE CONDITION:  Fair  ACTIVITY:  Activity as tolerated  OXYGEN:  Home Oxygen: No.   Oxygen Delivery: room air  DISCHARGE LOCATION:  home   If you experience worsening of your admission symptoms, develop shortness of breath, life threatening emergency, suicidal or homicidal thoughts you must seek medical attention immediately by calling 911 or calling your MD immediately  if symptoms less severe.  You Must read complete instructions/literature along with all the possible adverse reactions/side effects for all the Medicines you take and that have been prescribed to you. Take any new Medicines after you have completely understood and accpet all the possible adverse reactions/side effects.   Please note  You were cared for by a hospitalist during your hospital stay. If you have any questions about your discharge medications or the care you received while you were in the hospital after you are discharged, you can call the unit and asked to speak with the hospitalist on call if the hospitalist that took care of you is not available. Once you are discharged, your primary care physician will handle any further medical issues. Please note that NO REFILLS for any discharge medications will be authorized once you are discharged, as it is imperative that you return to your primary care physician (or establish a relationship with a primary care physician if you do not have one) for your aftercare needs so that they can reassess your need for medications and monitor your lab values.

## 2015-07-13 NOTE — Progress Notes (Signed)
Patient discharged from hospital today from admission for blood transfusion.  Does have history of anemia and is not able to tolerate oral iron due to GI distress

## 2015-07-13 NOTE — Progress Notes (Signed)
[place]  [phone] [phone]  ID: Antony Odea OB: October 14, 1941  MR#: [id number]  ZSW#:[id number]  Patient Care Team: Dion Body, MD as PCP - General (Family Medicine)  CHIEF COMPLAINT:  Chief Complaint  Patient presents with  . New Evaluation    anemia    INTERVAL HISTORY:  Patient is a 74 year old male who was recently discharged from the hospital with increasing shortness of breath and weakness and fatigue. He was found to be severely anemic. He received one unit of packed red blood cells and was subsequently discharged. Currently, patient feels improved but not back to his baseline. He has no neurologic complaints. He denies any fevers weight loss. He denies any chest pain. His shortness of breath has resolved. He denies any nausea, vomiting, constipation, or diarrhea. He has no melanoma or hematochezia. Patient otherwise feels well and offers no further specific complaints.  REVIEW OF SYSTEMS:   Review of Systems  Constitutional: Positive for malaise/fatigue.  Respiratory: Positive for shortness of breath.   Gastrointestinal: Negative for blood in stool and melena.  Neurological: Positive for weakness.    As per HPI. Otherwise, a complete review of systems is negatve.  PAST MEDICAL HISTORY: Past Medical History  Diagnosis Date  . Diabetes mellitus without complication   . Hypertension   . Anemia   . HLD (hyperlipidemia)     PAST SURGICAL HISTORY: No past surgical history on file.  FAMILY HISTORY Family History  Problem Relation Age of Onset  . Stroke Father   . Lung cancer Father        ADVANCED DIRECTIVES:    HEALTH MAINTENANCE: History  Substance Use Topics  . Smoking status: Never Smoker   . Smokeless tobacco: Current User    Types: Chew  . Alcohol Use: No     Colonoscopy:  PAP:  Bone density:  Lipid panel:  No Known Allergies  Current Outpatient Prescriptions  Medication Sig Dispense  Refill  . glipiZIDE (GLUCOTROL) 10 MG tablet Take 10 mg by mouth 2 (two) times daily.     Marland Kitchen NIFEdipine (PROCARDIA XL/ADALAT-CC) 90 MG 24 hr tablet Take 90 mg by mouth daily.    . pantoprazole (PROTONIX) 40 MG tablet Take 1 tablet (40 mg total) by mouth 2 (two) times daily. Switch for any other PPI at similar dose and frequency (Patient taking differently: Take 40 mg by mouth 2 (two) times daily. ) 30 tablet 3  . pravastatin (PRAVACHOL) 40 MG tablet Take 40 mg by mouth at bedtime.     . quinapril (ACCUPRIL) 20 MG tablet Take 40 mg by mouth daily.      No current facility-administered medications for this visit.    OBJECTIVE: There were no vitals filed for this visit.   There is no weight on file to calculate BMI.    ECOG FS:0 - Asymptomatic  General: Well-developed, well-nourished, no acute distress. Eyes: Pink conjunctiva, anicteric sclera. HEENT: Normocephalic, moist mucous membranes, clear oropharnyx. Lungs: Clear to auscultation bilaterally. Heart: Regular rate and rhythm. No rubs, murmurs, or gallops. Abdomen: Soft, nontender, nondistended. No organomegaly noted, normoactive bowel sounds. Musculoskeletal: No edema, cyanosis, or clubbing. Neuro: Alert, answering all questions appropriately. Cranial nerves grossly intact. Skin: No rashes or petechiae noted. Psych: Normal affect. Lymphatics: No cervical, calvicular, axillary or inguinal LAD.   LAB RESULTS:  Lab Results  Component Value Date   NA 141 07/13/2015   K 3.5 07/13/2015   CL 110 07/13/2015   CO2 26 07/13/2015  GLUCOSE 124* 07/13/2015   BUN 10 07/13/2015   CREATININE 1.12 07/13/2015   CALCIUM 8.6* 07/13/2015   PROT 6.9 07/12/2015   ALBUMIN 3.0* 07/12/2015   AST 22 07/12/2015   ALT 17 07/12/2015   ALKPHOS 75 07/12/2015   BILITOT 0.4 07/12/2015   GFRNONAA >60 07/13/2015   GFRAA >60 07/13/2015    Lab Results  Component Value Date   WBC 9.9 07/13/2015   NEUTROABS 10.5* 07/12/2015   HGB 7.5* 07/13/2015   HCT  24.8* 07/13/2015   MCV 64.4* 07/13/2015   PLT 280 07/13/2015     STUDIES: No results found.  ASSESSMENT:  Iron deficiency anemia.  PLAN:     1. Iron deficiency anemia: Unclear etiology, but patient has colonoscopy and EGD scheduled for Monday, July 19, 2015. He was found to have significantly reduced iron stores in early July. He received one unit of packed red blood cells yesterday with improvement of his hemoglobin. No intervention is needed at this time. Patient will return to clinic on July 22, 2015 for repeat laboratory work and further evaluation. Will consider IV iron or possibly additional blood transfusion at that time.  Patient expressed understanding and was in agreement with this plan. He also understands that He can call clinic at any time with any questions, concerns, or complaints.    Lloyd Huger, MD   07/13/2015 3:51 PM

## 2015-07-13 NOTE — Progress Notes (Signed)
Patient was discharge home as per order, discharge instruction provided, IV removed, patient condition improving HGB 7.9 pt discharged home as per order.

## 2015-07-13 NOTE — Progress Notes (Signed)
[place], [place].   07/13/2015  Patient: Aaron Allen   Date of Birth:  03-15-1941  Date of admission:  07/12/2015  Date of Discharge  07/13/2015    To Whom it May Concern:   Kaushik Maul  may return to work on 07/14/2015.  WORK-EMPLOYMENT:  Full Duty  If you have any questions or concerns, please don't hesitate to call.  Sincerely,   Myrtis Ser M.D Pager [phone] Office : [phone]   .

## 2015-07-14 ENCOUNTER — Telehealth: Payer: Self-pay | Admitting: *Deleted

## 2015-07-14 NOTE — Telephone Encounter (Signed)
Advised daughter that she will need to contac tmd who put him out of work for a note to return

## 2015-07-14 NOTE — Discharge Summary (Signed)
[facility] [place] NAME: Aaron Allen    MR#:  [id number]  DATE OF BIRTH:  07-06-1941  DATE OF ADMISSION:  07/12/2015 ADMITTING PHYSICIAN: Bettey Costa, MD  DATE OF DISCHARGE: 07/13/2015  PRIMARY CARE PHYSICIAN: Dion Body, MD    ADMISSION DIAGNOSIS:  Symptomatic anemia [D64.9]  DISCHARGE DIAGNOSIS:  Active Problems:   Anemia   SECONDARY DIAGNOSIS:   Past Medical History  Diagnosis Date  . Diabetes mellitus without complication   . Hypertension   . Anemia   . HLD (hyperlipidemia)     HOSPITAL COURSE:   1 symptomatic anemia: Presented from primary care office with shortness of breath and fatigue. He is known to have chronic anemia and is in the process of an outpatient workup for the same. On presentation his hemoglobin was 6.7. He was transfused 1 unit of packed red blood cells and is now up to 7.5. He is no longer symptomatic no shortness of breath chest pain or excessive fatigue. He already has scheduled appointments for iron infusion later today and for EGD and colonoscopy in the next weeks.  #2 hyponatremia: Mild hyponatremia at 131 on admission. Provided IV fluids overnight and now at 141 is normal.  #3 diabetes: No changes made to his home diabetic regimen  #4 lower extremity edema: Resolved at the time of discharge.  DISCHARGE CONDITIONS:   Stable  CONSULTS OBTAINED:     None  DRUG ALLERGIES:  No Known Allergies  DISCHARGE MEDICATIONS:   Discharge Medication List as of 07/13/2015  9:12 AM    CONTINUE these medications which have NOT CHANGED   Details  glipiZIDE (GLUCOTROL) 10 MG tablet Take 10 mg by mouth 2 (two) times daily. , Until Discontinued, Historical Med    NIFEdipine (PROCARDIA XL/ADALAT-CC) 90 MG 24 hr tablet Take 90 mg by mouth daily., Until Discontinued, Historical Med    pantoprazole (PROTONIX) 40 MG tablet Take 1 tablet (40 mg total) by mouth 2 (two) times  daily. Switch for any other PPI at similar dose and frequency, Starting 06/12/2015, Until Discontinued, Print    pravastatin (PRAVACHOL) 40 MG tablet Take 40 mg by mouth at bedtime. , Until Discontinued, Historical Med    quinapril (ACCUPRIL) 20 MG tablet Take 40 mg by mouth daily. , Until Discontinued, Historical Med         DISCHARGE INSTRUCTIONS:   See AVS for full instructions. He is discharged without additional home health needs   Today   CHIEF COMPLAINT:   Chief Complaint  Patient presents with  . Hypotension    HISTORY OF PRESENT ILLNESS:  Aaron Allen is a 74 y.o. male with a known history of iron deficiency anemia, HLD,diabetes and essential hypertension who presents above complaint. Patient was sent from his PCP office with low hemoglobin. Patient reports that he has had weakness. Patient denies melanoma, hematochezia or hematemesis. Patient was recently hospitalized and received 2 units of PRBCs. His iron panel was consistent with iron deficiency anemia. Patient was previously on iron supplementations however due to abdominal discomfort and pain he stopped this. He was seen by GI and referred as an outpatient for further evaluation and management. He is scheduled for EGD/colonoscopy on August 15. He is also scheduled for iron transfusion tomorrow at [facility] at 1:00. Patient is being admitted for iron transfusion.  VITAL SIGNS:  Blood pressure 138/58, pulse 78, temperature 98.5 F (36.9 C), temperature source Oral, resp. rate 20, height 5\' 9"  (  1.753 m), weight 78.472 kg (173 lb), SpO2 100 %.  I/O:  No intake or output data in the 24 hours ending 07/14/15 1504  PHYSICAL EXAMINATION:  GENERAL:  74 y.o.-year-old patient lying in the bed with no acute distress.  EYES: Pupils equal, round, reactive to light and accommodation. No scleral icterus. Extraocular muscles intact.  HEENT: Head atraumatic, normocephalic. Oropharynx and nasopharynx clear.  NECK:  Supple, no jugular  venous distention. No thyroid enlargement, no tenderness.  LUNGS: Normal breath sounds bilaterally, no wheezing, rales,rhonchi or crepitation. No use of accessory muscles of respiration.  CARDIOVASCULAR: S1, S2 normal. No murmurs, rubs, or gallops.  ABDOMEN: Soft, non-tender, non-distended. Bowel sounds present. No organomegaly or mass.  EXTREMITIES: No pedal edema, cyanosis, or clubbing.  NEUROLOGIC: Cranial nerves II through XII are intact. Muscle strength 5/5 in all extremities. Sensation intact. Gait not checked.  PSYCHIATRIC: [name].  SKIN: No obvious rash, lesion, or ulcer.   DATA REVIEW:   CBC  Recent Labs Lab 07/13/15 0419  WBC 9.9  HGB 7.5*  HCT 24.8*  PLT 280    Chemistries   Recent Labs Lab 07/12/15 1315  07/13/15 0419  NA 131*  --  141  K 3.0*  --  3.5  CL 98*  --  110  CO2 23  --  26  GLUCOSE 247*  --  124*  BUN 14  --  10  CREATININE 1.30*  --  1.12  CALCIUM 8.8*  --  8.6*  MG  --   < > 1.8  AST 22  --   --   ALT 17  --   --   ALKPHOS 75  --   --   BILITOT 0.4  --   --   < > = values in this interval not displayed.  Cardiac Enzymes  Recent Labs Lab 07/12/15 1315  TROPONINI <0.03    Microbiology Results  No results found for this or any previous visit.  RADIOLOGY:  No results found.  EKG:   Orders placed or performed during the hospital encounter of 07/12/15  . ED EKG  . ED EKG  . EKG 12-Lead  . EKG 12-Lead      Management plans discussed with the patient, family and they are in agreement.  CODE STATUS: Full  TOTAL TIME TAKING CARE OF THIS PATIENT: 25 minutes.  Greater than 50% of time spent in care coordination and counseling.  Myrtis Ser M.D on 07/14/2015 at 3:04 PM  Between 7am to 6pm - Pager - [phone]  After 6pm go to [email or web address] - password [email or web address] [facility]  Office  [phone]  CC: Primary care physician; Dion Body, MD

## 2015-07-16 ENCOUNTER — Ambulatory Visit: Payer: Medicare PPO | Admitting: Oncology

## 2015-07-19 ENCOUNTER — Encounter: Payer: Self-pay | Admitting: *Deleted

## 2015-07-19 ENCOUNTER — Ambulatory Visit: Payer: Medicare PPO | Admitting: Anesthesiology

## 2015-07-19 ENCOUNTER — Encounter
Admission: RE | Disposition: A | Discharge status: Home / Self Care | Payer: Self-pay | Source: Ambulatory Visit | Attending: Gastroenterology

## 2015-07-19 ENCOUNTER — Ambulatory Visit
Admission: RE | Admit: 2015-07-19 | Discharge: 2015-07-19 | Disposition: A | Discharge status: Home / Self Care | Payer: Medicare PPO | Source: Ambulatory Visit | Attending: Gastroenterology | Admitting: Gastroenterology

## 2015-07-19 DIAGNOSIS — K641 Second degree hemorrhoids: Secondary | ICD-10-CM | POA: Insufficient documentation

## 2015-07-19 DIAGNOSIS — C183 Malignant neoplasm of hepatic flexure: Secondary | ICD-10-CM | POA: Diagnosis not present

## 2015-07-19 DIAGNOSIS — E1122 Type 2 diabetes mellitus with diabetic chronic kidney disease: Secondary | ICD-10-CM | POA: Diagnosis not present

## 2015-07-19 DIAGNOSIS — Z79899 Other long term (current) drug therapy: Secondary | ICD-10-CM | POA: Diagnosis not present

## 2015-07-19 DIAGNOSIS — E785 Hyperlipidemia, unspecified: Secondary | ICD-10-CM | POA: Insufficient documentation

## 2015-07-19 DIAGNOSIS — N189 Chronic kidney disease, unspecified: Secondary | ICD-10-CM | POA: Diagnosis not present

## 2015-07-19 DIAGNOSIS — Z87891 Personal history of nicotine dependence: Secondary | ICD-10-CM | POA: Insufficient documentation

## 2015-07-19 DIAGNOSIS — I340 Nonrheumatic mitral (valve) insufficiency: Secondary | ICD-10-CM | POA: Diagnosis not present

## 2015-07-19 DIAGNOSIS — K219 Gastro-esophageal reflux disease without esophagitis: Secondary | ICD-10-CM | POA: Diagnosis not present

## 2015-07-19 DIAGNOSIS — D509 Iron deficiency anemia, unspecified: Secondary | ICD-10-CM | POA: Insufficient documentation

## 2015-07-19 DIAGNOSIS — I129 Hypertensive chronic kidney disease with stage 1 through stage 4 chronic kidney disease, or unspecified chronic kidney disease: Secondary | ICD-10-CM | POA: Insufficient documentation

## 2015-07-19 DIAGNOSIS — Z801 Family history of malignant neoplasm of trachea, bronchus and lung: Secondary | ICD-10-CM | POA: Diagnosis not present

## 2015-07-19 DIAGNOSIS — M1990 Unspecified osteoarthritis, unspecified site: Secondary | ICD-10-CM | POA: Diagnosis not present

## 2015-07-19 DIAGNOSIS — Z823 Family history of stroke: Secondary | ICD-10-CM | POA: Insufficient documentation

## 2015-07-19 HISTORY — PX: COLONOSCOPY WITH PROPOFOL: SHX5780

## 2015-07-19 HISTORY — DX: Nonrheumatic mitral (valve) insufficiency: I34.0

## 2015-07-19 HISTORY — DX: Unspecified osteoarthritis, unspecified site: M19.90

## 2015-07-19 HISTORY — PX: ESOPHAGOGASTRODUODENOSCOPY (EGD) WITH PROPOFOL: SHX5813

## 2015-07-19 LAB — GLUCOSE, CAPILLARY: Glucose-Capillary: 170 mg/dL — ABNORMAL HIGH (ref 65–99)

## 2015-07-19 SURGERY — COLONOSCOPY WITH PROPOFOL
Anesthesia: General

## 2015-07-19 MED ORDER — SPOT INK MARKER SYRINGE KIT
PACK | SUBMUCOSAL | Status: DC | PRN
  Administered 2015-07-19: 5 mL via SUBMUCOSAL
  Administered 2015-07-19: 3 mL via SUBMUCOSAL

## 2015-07-19 MED ORDER — SODIUM CHLORIDE 0.9 % IV SOLN
INTRAVENOUS | Status: DC
  Administered 2015-07-19: 1000 mL via INTRAVENOUS

## 2015-07-19 MED ORDER — SODIUM CHLORIDE 0.9 % IV SOLN: INTRAVENOUS | Status: DC

## 2015-07-19 MED ORDER — PROPOFOL 10 MG/ML IV BOLUS
INTRAVENOUS | Status: DC | PRN
  Administered 2015-07-19 (×9): 20 mg via INTRAVENOUS

## 2015-07-19 MED ORDER — MIDAZOLAM HCL 2 MG/2ML IJ SOLN
INTRAMUSCULAR | Status: DC | PRN
  Administered 2015-07-19: 1 mg via INTRAVENOUS

## 2015-07-19 NOTE — Discharge Instructions (Signed)

## 2015-07-19 NOTE — Op Note (Addendum)
[facility] Gastroenterology Patient Name: Aaron Allen Procedure Date: 07/19/2015 9:48 AM MRN: [id number] Account #: [id number] Date of Birth: 02-01-1941 Admit Type: Outpatient Age: 73 Room: [facility] ENDO ROOM 2 Gender: Male Note Status: Finalized Procedure:         Colonoscopy Indications:       Iron deficiency anemia Patient Profile:   This is a 74 year old male. Providers:         Gerrit Heck. Rayann Heman, MD Referring MD:      Dion Body (Referring MD) Medicines:         Propofol per Anesthesia Complications:     No immediate complications. Procedure:         Pre-Anesthesia Assessment:                    - Prior to the procedure, a History and Physical was                     performed, and patient medications, allergies and                     sensitivities were reviewed. The patient's tolerance of                     previous anesthesia was reviewed.                    - Prior to the procedure, a History and Physical was                     performed, and patient medications, allergies and                     sensitivities were reviewed. The patient's tolerance of                     previous anesthesia was reviewed.                    After obtaining informed consent, the colonoscope was                     passed under direct vision. Throughout the procedure, the                     patient's blood pressure, pulse, and oxygen saturations                     were monitored continuously. The Colonoscope was                     introduced through the anus and advanced to the the                     hepatic flexure to examine a mass. This was the intended                     extent. The colonoscopy was performed without difficulty.                     The patient tolerated the procedure well. The quality of                     the bowel preparation was fair. Findings:      The perianal exam was abnormal.      An  ulcerated fungating, completely obstructing large  mass was found at       the hepatic flexure vs cecum. Can't determine if the mass is cecum or       hepatic flexure because couldn't pass the mass and anatomy obscured In       addition, its diameter measured thirty mm. No bleeding was present.       Biopsies were taken with a cold forceps for histology. Area was tattooed       with an injection of 5 mL of Spot (carbon black) distal to both the mass       and the two polyps.      A 8 mm polyp was found at the hepatic flexure. The polyp was sessile.      A 9 mm polyp was found in the proximal ascending colon. The polyp was       sessile.      Internal hemorrhoids were found during retroflexion. The hemorrhoids       were Grade II (internal hemorrhoids that prolapse but reduce       spontaneously).      The exam was otherwise without abnormality. Impression:        - Abnormal perianal exam.                    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 Biopsied. Tattooed. NOT REMOVED                    - One 8 mm polyp at the hepatic flexure. NOT REMOVED                    - One 9 mm polyp in the proximal ascending colon. NOT                     REMOVED.                    - Internal hemorrhoids.                    - Tatoos placed just distal to the mass and two polyps.                     Mass likely hepatic flexure and polyps proximal transverse.                    - The examination was otherwise normal. Recommendation:    - Observe patient in GI recovery unit.                    - Resume regular diet.                    - Continue present medications.                    -  Refer to a surgeon at appointment to be scheduled.                    - Await pathology results.                     - Repeat colonoscopy in 1 year for surveillance                    - Return to referring physician.                    - The findings and recommendations were discussed with the                     patient.                    - The findings and recommendations were discussed with the                     patient's family. Procedure Code(s): --- Professional ---                    [phone], 35, Colonoscopy, flexible; with biopsy, single or                     multiple                    [id number], 52, Colonoscopy, flexible; with directed submucosal                     injection(s), any substance CPT copyright 2014 American Medical Association. All rights reserved. The codes documented in this report are preliminary and upon coder review may  be revised to meet current compliance requirements. [name], MD 07/19/2015 10:41:29 AM [name] Note Initiated On: 07/19/2015 9:48 AM Scope Withdrawal Time: 0 hours 21 minutes 6 seconds  Total Procedure Duration: 0 hours 26 minutes 5 seconds       [facility]

## 2015-07-19 NOTE — Op Note (Signed)
[facility] Gastroenterology Patient Name: Aaron Allen Procedure Date: 07/19/2015 9:49 AM MRN: [id number] Account #: [id number] Date of Birth: Apr 06, 1941 Admit Type: Outpatient Age: 74 Room: [facility] ENDO ROOM 2 Gender: Male Note Status: Finalized Procedure:         Upper GI endoscopy Indications:       Iron deficiency anemia Patient Profile:   This is a 74 year old male. Providers:         Gerrit Heck. Rayann Heman, MD Referring MD:      Dion Body (Referring MD) Medicines:         Propofol per Anesthesia Complications:     No immediate complications. Procedure:         Pre-Anesthesia Assessment:                    - Prior to the procedure, a History and Physical was                     performed, and patient medications, allergies and                     sensitivities were reviewed. The patient's tolerance of                     previous anesthesia was reviewed.                    After obtaining informed consent, the endoscope was passed                     under direct vision. Throughout the procedure, the                     patient's blood pressure, pulse, and oxygen saturations                     were monitored continuously. The Olympus GIF-160 endoscope                     (S#. [id number]) was introduced through the mouth, and                     advanced to the second part of duodenum. The upper GI                     endoscopy was accomplished without difficulty. The patient                     tolerated the procedure well. Findings:      The esophagus was normal.      The stomach was normal.      The examined duodenum was normal. Impression:        - Normal esophagus.                    - Normal stomach.                    - Normal examined duodenum.                    - No specimens collected. Recommendation:    - Perform a colonoscopy today.                    - The findings and recommendations were discussed with the  patient.              - The findings and recommendations were discussed with the                     patient's family. Procedure Code(s): --- Professional ---                    [phone], Esophagogastroduodenoscopy, flexible, transoral;                     diagnostic, including collection of specimen(s) by                     brushing or washing, when performed (separate procedure) CPT copyright 2014 American Medical Association. All rights reserved. The codes documented in this report are preliminary and upon coder review may  be revised to meet current compliance requirements. [name], MD 07/19/2015 10:03:29 AM [name] Note Initiated On: 07/19/2015 9:49 AM      [facility]

## 2015-07-19 NOTE — H&P (Signed)
  Primary Care Physician:  Dion Body, MD  Pre-Procedure History & Physical: HPI:  Aaron Allen is a 74 y.o. male is here for an colonoscopy / EGD   Past Medical History  Diagnosis Date  . Diabetes mellitus without complication   . Hypertension   . Anemia   . HLD (hyperlipidemia)   . Chronic kidney disease   . Arthritis   . Moderate mitral regurgitation     History reviewed. No pertinent past surgical history.  Prior to Admission medications   Medication Sig Start Date End Date Taking? Authorizing Provider  NIFEdipine (PROCARDIA XL/ADALAT-CC) 90 MG 24 hr tablet Take 90 mg by mouth daily.   Yes Historical Provider, MD  glipiZIDE (GLUCOTROL) 10 MG tablet Take 10 mg by mouth 2 (two) times daily.     Historical Provider, MD  pantoprazole (PROTONIX) 40 MG tablet Take 1 tablet (40 mg total) by mouth 2 (two) times daily. Switch for any other PPI at similar dose and frequency Patient taking differently: Take 40 mg by mouth 2 (two) times daily.  06/12/15   Theodoro Grist, MD  pravastatin (PRAVACHOL) 40 MG tablet Take 40 mg by mouth at bedtime.     Historical Provider, MD  quinapril (ACCUPRIL) 20 MG tablet Take 40 mg by mouth daily.     Historical Provider, MD    Allergies as of 07/13/2015  . (No Known Allergies)    Family History  Problem Relation Age of Onset  . Stroke Father   . Lung cancer Father     Social History   Social History  . Marital Status: Widowed    Spouse Name: N/A  . Number of Children: N/A  . Years of Education: N/A   Occupational History  . Not on file.   Social History Main Topics  . Smoking status: Former Research scientist (life sciences)  . Smokeless tobacco: Current User    Types: Chew  . Alcohol Use: No  . Drug Use: No  . Sexual Activity: Not on file   Other Topics Concern  . Not on file   Social History Narrative     Physical Exam: BP 179/66 mmHg  Pulse 85  Temp(Src) 98.5 F (36.9 C) (Tympanic)  Resp 20  SpO2 100% General:   Alert,  pleasant and  cooperative in NAD Head:  Normocephalic and atraumatic. Neck:  Supple; no masses or thyromegaly. Lungs:  Clear throughout to auscultation.    Heart:  Regular rate and rhythm. Abdomen:  Soft, nontender and nondistended. Normal bowel sounds, without guarding, and without rebound.   Neurologic:  Alert and  oriented x4;  grossly normal neurologically.  Impression/Plan: Aaron Allen is here for an colonoscopy / EGD to be performed for ida  Risks, benefits, limitations, and alternatives regarding  colonoscopy / EGD have been reviewed with the patient.  Questions have been answered.  All parties agreeable.   Josefine Class, MD  07/19/2015, 9:45 AM

## 2015-07-19 NOTE — Anesthesia Postprocedure Evaluation (Signed)
  Anesthesia Post-op Note  Patient: Aaron Allen  Procedure(s) Performed: Procedure(s): COLONOSCOPY WITH PROPOFOL (N/A) ESOPHAGOGASTRODUODENOSCOPY (EGD) WITH PROPOFOL (N/A)  Anesthesia type:General  Patient location: PACU  Post pain: Pain level controlled  Post assessment: Post-op Vital signs reviewed, Patient's Cardiovascular Status Stable, Respiratory Function Stable, Patent Airway and No signs of Nausea or vomiting  Post vital signs: Reviewed and stable  Last Vitals:  Filed Vitals:   07/19/15 1038  BP: 127/67  Pulse: 75  Temp: 36.3 C  Resp: 18    Level of consciousness: awake, alert  and patient cooperative  Complications: No apparent anesthesia complications

## 2015-07-19 NOTE — Transfer of Care (Signed)
Immediate Anesthesia Transfer of Care Note  Patient: Aaron Allen  Procedure(s) Performed: Procedure(s): COLONOSCOPY WITH PROPOFOL (N/A) ESOPHAGOGASTRODUODENOSCOPY (EGD) WITH PROPOFOL (N/A)  Patient Location: PACU and Endoscopy Unit  Anesthesia Type:General  Level of Consciousness: alert   Airway & Oxygen Therapy: Patient connected to nasal cannula oxygen  Post-op Assessment: Post -op Vital signs reviewed and stable  Post vital signs: stable  Last Vitals:  Filed Vitals:   07/19/15 1038  BP: 127/67  Pulse: 75  Temp: 36.3 C  Resp: 18    Complications: No apparent anesthesia complications

## 2015-07-19 NOTE — Anesthesia Preprocedure Evaluation (Signed)
Anesthesia Evaluation    Airway Mallampati: II       Dental  (+) Teeth Intact   Pulmonary former smoker,          Cardiovascular hypertension, + Valvular Problems/Murmurs MR Rhythm:Regular Rate:Normal     Neuro/Psych    GI/Hepatic   Endo/Other  diabetes  Renal/GU Renal disease     Musculoskeletal  (+) Arthritis -,   Abdominal   Peds  Hematology  (+) anemia ,   Anesthesia Other Findings   Reproductive/Obstetrics                             Anesthesia Physical Anesthesia Plan  ASA: III  Anesthesia Plan: General   Post-op Pain Management:    Induction:   Airway Management Planned:   Additional Equipment:   Intra-op Plan:   Post-operative Plan:   Informed Consent:   Plan Discussed with:   Anesthesia Plan Comments:         Anesthesia Quick Evaluation

## 2015-07-20 ENCOUNTER — Encounter: Payer: Self-pay | Admitting: Gastroenterology

## 2015-07-22 ENCOUNTER — Inpatient Hospital Stay: Payer: Medicare PPO

## 2015-07-22 ENCOUNTER — Inpatient Hospital Stay (HOSPITAL_BASED_OUTPATIENT_CLINIC_OR_DEPARTMENT_OTHER): Payer: Medicare PPO | Admitting: Oncology

## 2015-07-22 VITALS — BP 149/75 | HR 82 | Temp 95.2°F | Resp 18 | Wt 171.1 lb

## 2015-07-22 DIAGNOSIS — M129 Arthropathy, unspecified: Secondary | ICD-10-CM

## 2015-07-22 DIAGNOSIS — R5383 Other fatigue: Secondary | ICD-10-CM

## 2015-07-22 DIAGNOSIS — C189 Malignant neoplasm of colon, unspecified: Secondary | ICD-10-CM

## 2015-07-22 DIAGNOSIS — I340 Nonrheumatic mitral (valve) insufficiency: Secondary | ICD-10-CM

## 2015-07-22 DIAGNOSIS — R531 Weakness: Secondary | ICD-10-CM

## 2015-07-22 DIAGNOSIS — Z79899 Other long term (current) drug therapy: Secondary | ICD-10-CM | POA: Diagnosis not present

## 2015-07-22 DIAGNOSIS — D509 Iron deficiency anemia, unspecified: Secondary | ICD-10-CM | POA: Diagnosis not present

## 2015-07-22 DIAGNOSIS — E119 Type 2 diabetes mellitus without complications: Secondary | ICD-10-CM

## 2015-07-22 DIAGNOSIS — N189 Chronic kidney disease, unspecified: Secondary | ICD-10-CM

## 2015-07-22 DIAGNOSIS — I129 Hypertensive chronic kidney disease with stage 1 through stage 4 chronic kidney disease, or unspecified chronic kidney disease: Secondary | ICD-10-CM

## 2015-07-22 DIAGNOSIS — D649 Anemia, unspecified: Secondary | ICD-10-CM

## 2015-07-22 DIAGNOSIS — E785 Hyperlipidemia, unspecified: Secondary | ICD-10-CM

## 2015-07-22 LAB — CBC
HEMATOCRIT: 25.4 % — AB (ref 40.0–52.0)
HEMOGLOBIN: 7.8 g/dL — AB (ref 13.0–18.0)
MCH: 19 pg — ABNORMAL LOW (ref 26.0–34.0)
MCHC: 30.9 g/dL — AB (ref 32.0–36.0)
MCV: 61.6 fL — ABNORMAL LOW (ref 80.0–100.0)
Platelets: 434 10*3/uL (ref 150–440)
RBC: 4.12 MIL/uL — ABNORMAL LOW (ref 4.40–5.90)
RDW: 25.4 % — AB (ref 11.5–14.5)
WBC: 13.1 10*3/uL — AB (ref 3.8–10.6)

## 2015-07-22 LAB — VITAMIN B12: Vitamin B-12: 427 pg/mL (ref 180–914)

## 2015-07-22 LAB — SAMPLE TO BLOOD BANK

## 2015-07-22 LAB — IRON AND TIBC
IRON: 16 ug/dL — AB (ref 45–182)
Saturation Ratios: 5 % — ABNORMAL LOW (ref 17.9–39.5)
TIBC: 352 ug/dL (ref 250–450)
UIBC: 336 ug/dL

## 2015-07-22 LAB — FOLATE: Folate: 11 ng/mL (ref >5.9)

## 2015-07-22 LAB — LACTATE DEHYDROGENASE: LDH: 190 U/L (ref 98–192)

## 2015-07-22 LAB — FERRITIN: FERRITIN: 22 ng/mL — AB (ref 24–336)

## 2015-07-22 MED ORDER — SODIUM CHLORIDE 0.9 % IV SOLN
Freq: Once | INTRAVENOUS | Status: AC
  Administered 2015-07-22: 250 mL via INTRAVENOUS
  Filled 2015-07-22: qty 1000

## 2015-07-22 MED ORDER — SODIUM CHLORIDE 0.9 % IV SOLN
510.0000 mg | Freq: Once | INTRAVENOUS | Status: AC
  Administered 2015-07-22: 510 mg via INTRAVENOUS
  Filled 2015-07-22: qty 17

## 2015-07-22 NOTE — Progress Notes (Signed)
  Oncology Nurse Navigator Documentation  Referral date to RadOnc/MedOnc: 07/22/15 (07/22/15 0900) Navigator Encounter Type: Initial MedOnc (07/22/15 0900)     Barriers/Navigation Needs: No barriers at this time (07/22/15 0900)         Met with Mr Aaron Allen and his son with Dr Grayland Ormond in the MD exam room. Provided my contact information for further information. Will meet with Dr Nicholes Stairs next week.for consultation.

## 2015-07-27 ENCOUNTER — Ambulatory Visit: Payer: Medicare PPO | Admitting: Hematology and Oncology

## 2015-07-27 ENCOUNTER — Ambulatory Visit
Admission: RE | Admit: 2015-07-27 | Discharge: 2015-07-27 | Disposition: A | Discharge status: Home / Self Care | Payer: Medicare PPO | Source: Ambulatory Visit | Attending: Oncology | Admitting: Oncology

## 2015-07-27 DIAGNOSIS — J439 Emphysema, unspecified: Secondary | ICD-10-CM | POA: Diagnosis not present

## 2015-07-27 DIAGNOSIS — I7090 Unspecified atherosclerosis: Secondary | ICD-10-CM | POA: Insufficient documentation

## 2015-07-27 DIAGNOSIS — M47896 Other spondylosis, lumbar region: Secondary | ICD-10-CM | POA: Diagnosis not present

## 2015-07-27 DIAGNOSIS — C787 Secondary malignant neoplasm of liver and intrahepatic bile duct: Secondary | ICD-10-CM | POA: Diagnosis not present

## 2015-07-27 DIAGNOSIS — C189 Malignant neoplasm of colon, unspecified: Secondary | ICD-10-CM | POA: Diagnosis not present

## 2015-07-27 DIAGNOSIS — C786 Secondary malignant neoplasm of retroperitoneum and peritoneum: Secondary | ICD-10-CM | POA: Diagnosis not present

## 2015-07-27 DIAGNOSIS — K5669 Other intestinal obstruction: Secondary | ICD-10-CM | POA: Diagnosis not present

## 2015-07-27 MED ORDER — IOHEXOL 300 MG/ML  SOLN
100.0000 mL | Freq: Once | INTRAMUSCULAR | Status: AC | PRN
  Administered 2015-07-27: 100 mL via INTRAVENOUS

## 2015-07-27 NOTE — Progress Notes (Signed)
[place]  Telephone:(336) [phone] Fax:(336) [phone]  ID: Aaron Allen OB: [birth year]  MR#: [id number]  VZD#:[id number]  Patient Care Team: Dion Body, MD as PCP - General (Family Medicine) Clent Jacks, RN as Registered Nurse  CHIEF COMPLAINT:  Chief Complaint  Patient presents with  . Follow-up    IDA    INTERVAL HISTORY:  Patient returns to clinic today for further evaluation, discussion of his colonoscopy results, and consideration of IV iron. Currently, patient feels well and is asymptomatic. He denies any further melanoma or hematochezia. He has no neurologic complaints. He denies any fevers weight loss. He denies any chest pain. His shortness of breath has resolved. He denies any nausea, vomiting, constipation, or diarrhea. Patient offers no specific complaints today.  REVIEW OF SYSTEMS:   Review of Systems  Constitutional: Positive for malaise/fatigue.  Respiratory: Negative for shortness of breath.   Gastrointestinal: Negative for blood in stool and melena.  Neurological: Positive for weakness.    As per HPI. Otherwise, a complete review of systems is negatve.  PAST MEDICAL HISTORY: Past Medical History  Diagnosis Date  . Diabetes mellitus without complication   . Hypertension   . Anemia   . HLD (hyperlipidemia)   . Chronic kidney disease   . Arthritis   . Moderate mitral regurgitation   . Cancer     Colon CA    PAST SURGICAL HISTORY: Past Surgical History  Procedure Laterality Date  . Colonoscopy with propofol N/A 07/19/2015    Procedure: COLONOSCOPY WITH PROPOFOL;  Surgeon: Josefine Class, MD;  Location: [facility] ENDOSCOPY;  Service: Endoscopy;  Laterality: N/A;  . Esophagogastroduodenoscopy (egd) with propofol N/A 07/19/2015    Procedure: ESOPHAGOGASTRODUODENOSCOPY (EGD) WITH PROPOFOL;  Surgeon: Josefine Class, MD;  Location: [facility] ENDOSCOPY;  Service: Endoscopy;  Laterality: N/A;    FAMILY HISTORY Family History    Problem Relation Age of Onset  . Stroke Father   . Lung cancer Father        ADVANCED DIRECTIVES:    HEALTH MAINTENANCE: Social History  Substance Use Topics  . Smoking status: Former Research scientist (life sciences)  . Smokeless tobacco: Current User    Types: Chew  . Alcohol Use: No     Colonoscopy:  PAP:  Bone density:  Lipid panel:  No Known Allergies  Current Outpatient Prescriptions  Medication Sig Dispense Refill  . glipiZIDE (GLUCOTROL) 10 MG tablet Take 10 mg by mouth 2 (two) times daily.     Marland Kitchen NIFEdipine (PROCARDIA XL/ADALAT-CC) 90 MG 24 hr tablet Take 90 mg by mouth daily.    . pantoprazole (PROTONIX) 40 MG tablet Take 1 tablet (40 mg total) by mouth 2 (two) times daily. Switch for any other PPI at similar dose and frequency (Patient taking differently: Take 40 mg by mouth 2 (two) times daily. ) 30 tablet 3  . pravastatin (PRAVACHOL) 40 MG tablet Take 40 mg by mouth at bedtime.     . quinapril (ACCUPRIL) 20 MG tablet Take 40 mg by mouth daily.      No current facility-administered medications for this visit.    OBJECTIVE: Filed Vitals:   07/22/15 0914  BP: 149/75  Pulse: 82  Temp: 95.2 F (35.1 C)  Resp: 18     Body mass index is 25.25 kg/(m^2).    ECOG FS:0 - Asymptomatic  General: Well-developed, well-nourished, no acute distress. Eyes: Pink conjunctiva, anicteric sclera. Lungs: Clear to auscultation bilaterally. Heart: Regular rate and rhythm. No rubs, murmurs, or gallops. Abdomen: Soft,  nontender, nondistended. No organomegaly noted, normoactive bowel sounds. Musculoskeletal: No edema, cyanosis, or clubbing. Neuro: Alert, answering all questions appropriately. Cranial nerves grossly intact. Skin: No rashes or petechiae noted. Psych: Normal affect.   LAB RESULTS:  Lab Results  Component Value Date   NA 141 07/13/2015   K 3.5 07/13/2015   CL 110 07/13/2015   CO2 26 07/13/2015   GLUCOSE 124* 07/13/2015   BUN 10 07/13/2015   CREATININE 1.12 07/13/2015   CALCIUM  8.6* 07/13/2015   PROT 6.9 07/12/2015   ALBUMIN 3.0* 07/12/2015   AST 22 07/12/2015   ALT 17 07/12/2015   ALKPHOS 75 07/12/2015   BILITOT 0.4 07/12/2015   GFRNONAA >60 07/13/2015   GFRAA >60 07/13/2015    Lab Results  Component Value Date   WBC 13.1* 07/22/2015   NEUTROABS 10.5* 07/12/2015   HGB 7.8* 07/22/2015   HCT 25.4* 07/22/2015   MCV 61.6* 07/22/2015   PLT 434 07/22/2015     STUDIES: No results found.  ASSESSMENT:  Iron deficiency anemia, colon cancer.  PLAN:    1. Colon cancer: Colonoscopy revealed a near obstructing lesion and patient has an appointment with surgery next week. We will get CT scan of the abdomen and pelvis for staging purposes. Patient will return to clinic in approximately 4 weeks, possibly postoperatively to discuss his pathology results as well as treatment planning if necessary. 2. 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 Patient's hemoglobin is improved, but not back to baseline. He  will receive 510 mg IV Feraheme today and then return to clinic in 1 week for a second infusion.   Approximately 30 minutes was spent in discussion and consultation.   Patient expressed understanding and was in agreement with this plan. He also understands that He can call clinic at any time with any questions, concerns, or complaints.    Lloyd Huger, MD   07/27/2015 8:49 AM

## 2015-07-29 ENCOUNTER — Inpatient Hospital Stay: Payer: Medicare PPO

## 2015-07-29 VITALS — BP 129/67 | HR 69 | Temp 97.2°F | Resp 18

## 2015-07-29 DIAGNOSIS — C189 Malignant neoplasm of colon, unspecified: Secondary | ICD-10-CM

## 2015-07-29 DIAGNOSIS — D509 Iron deficiency anemia, unspecified: Secondary | ICD-10-CM

## 2015-07-29 LAB — COMPREHENSIVE METABOLIC PANEL
ALT: 26 U/L (ref 17–63)
AST: 29 U/L (ref 15–41)
Albumin: 2.9 g/dL — ABNORMAL LOW (ref 3.5–5.0)
Alkaline Phosphatase: 111 U/L (ref 38–126)
Anion gap: 3 — ABNORMAL LOW (ref 5–15)
BUN: 16 mg/dL (ref 6–20)
CHLORIDE: 103 mmol/L (ref 101–111)
CO2: 28 mmol/L (ref 22–32)
CREATININE: 1.42 mg/dL — AB (ref 0.61–1.24)
Calcium: 8.2 mg/dL — ABNORMAL LOW (ref 8.9–10.3)
GFR, EST AFRICAN AMERICAN: 54 mL/min — AB (ref >60)
GFR, EST NON AFRICAN AMERICAN: 46 mL/min — AB (ref >60)
Glucose, Bld: 139 mg/dL — ABNORMAL HIGH (ref 65–99)
POTASSIUM: 3.4 mmol/L — AB (ref 3.5–5.1)
SODIUM: 134 mmol/L — AB (ref 135–145)
Total Bilirubin: 0.5 mg/dL (ref 0.3–1.2)
Total Protein: 6.9 g/dL (ref 6.5–8.1)

## 2015-07-29 LAB — PROTIME-INR
INR: 1.16
PROTHROMBIN TIME: 15 s (ref 11.4–15.0)

## 2015-07-29 LAB — CBC WITH DIFFERENTIAL/PLATELET
BASOS ABS: 0 10*3/uL (ref 0–0.1)
BASOS PCT: 0 %
EOS ABS: 0.1 10*3/uL (ref 0–0.7)
Eosinophils Relative: 1 %
HEMATOCRIT: 23.5 % — AB (ref 40.0–52.0)
HEMOGLOBIN: 7.3 g/dL — AB (ref 13.0–18.0)
Lymphocytes Relative: 7 %
Lymphs Abs: 1 10*3/uL (ref 1.0–3.6)
MCH: 19.4 pg — ABNORMAL LOW (ref 26.0–34.0)
MCHC: 30.8 g/dL — AB (ref 32.0–36.0)
MCV: 62.8 fL — ABNORMAL LOW (ref 80.0–100.0)
MONOS PCT: 7 %
Monocytes Absolute: 1 10*3/uL (ref 0.2–1.0)
NEUTROS ABS: 11.3 10*3/uL — AB (ref 1.4–6.5)
NEUTROS PCT: 85 %
Platelets: 434 10*3/uL (ref 150–440)
RBC: 3.75 MIL/uL — ABNORMAL LOW (ref 4.40–5.90)
RDW: 27 % — ABNORMAL HIGH (ref 11.5–14.5)
WBC: 13.4 10*3/uL — AB (ref 3.8–10.6)

## 2015-07-29 MED ORDER — SODIUM CHLORIDE 0.9 % IV SOLN
Freq: Once | INTRAVENOUS | Status: AC
  Administered 2015-07-29: 15:00:00 via INTRAVENOUS
  Filled 2015-07-29: qty 1000

## 2015-07-29 MED ORDER — SODIUM CHLORIDE 0.9 % IV SOLN
510.0000 mg | Freq: Once | INTRAVENOUS | Status: AC
  Administered 2015-07-29: 510 mg via INTRAVENOUS
  Filled 2015-07-29: qty 17

## 2015-08-02 ENCOUNTER — Other Ambulatory Visit: Payer: Self-pay | Admitting: Oncology

## 2015-08-02 DIAGNOSIS — D509 Iron deficiency anemia, unspecified: Secondary | ICD-10-CM

## 2015-08-05 ENCOUNTER — Encounter
Admission: RE | Admit: 2015-08-05 | Discharge: 2015-08-05 | Disposition: A | Discharge status: Home / Self Care | Payer: Medicare PPO | Source: Ambulatory Visit | Attending: Surgery | Admitting: Surgery

## 2015-08-05 DIAGNOSIS — Z01812 Encounter for preprocedural laboratory examination: Secondary | ICD-10-CM | POA: Diagnosis not present

## 2015-08-05 HISTORY — DX: Other complications of anesthesia, initial encounter: T88.59XA

## 2015-08-05 HISTORY — DX: Adverse effect of unspecified anesthetic, initial encounter: T41.45XA

## 2015-08-05 LAB — CBC
HCT: 26 % — ABNORMAL LOW (ref 40.0–52.0)
Hemoglobin: 7.9 g/dL — ABNORMAL LOW (ref 13.0–18.0)
MCH: 19.8 pg — AB (ref 26.0–34.0)
MCHC: 30.5 g/dL — ABNORMAL LOW (ref 32.0–36.0)
MCV: 65 fL — AB (ref 80.0–100.0)
PLATELETS: 456 10*3/uL — AB (ref 150–440)
RBC: 4 MIL/uL — AB (ref 4.40–5.90)
RDW: 28.8 % — ABNORMAL HIGH (ref 11.5–14.5)
WBC: 17.8 10*3/uL — AB (ref 3.8–10.6)

## 2015-08-05 LAB — SURGICAL PCR SCREEN
MRSA, PCR: NEGATIVE
Staphylococcus aureus: NEGATIVE

## 2015-08-05 LAB — BASIC METABOLIC PANEL
Anion gap: 8 (ref 5–15)
BUN: 13 mg/dL (ref 6–20)
CALCIUM: 9 mg/dL (ref 8.9–10.3)
CO2: 26 mmol/L (ref 22–32)
CREATININE: 1.19 mg/dL (ref 0.61–1.24)
Chloride: 103 mmol/L (ref 101–111)
GFR calc non Af Amer: 58 mL/min — ABNORMAL LOW (ref >60)
Glucose, Bld: 89 mg/dL (ref 65–99)
Potassium: 3.4 mmol/L — ABNORMAL LOW (ref 3.5–5.1)
SODIUM: 137 mmol/L (ref 135–145)

## 2015-08-05 NOTE — Patient Instructions (Signed)
  Your procedure is scheduled on: 08/13/15 Report to Day Surgery.[facility] SECOND FLOOR To find out your arrival time please call [phone] between 1PM - 3PM on 08/12/15.  Remember: Instructions that are not followed completely may result in serious medical risk, up to and including death, or upon the discretion of your surgeon and anesthesiologist your surgery may need to be rescheduled.    _X___ 1. Do not eat food or drink liquids after midnight. No gum chewing or hard candies.     __X__ 2. No Alcohol for 24 hours before or after surgery.   ____ 3. Bring all medications with you on the day of surgery if instructed.    __X__ 4. Notify your doctor if there is any change in your medical condition     (cold, fever, infections).     Do not wear jewelry, make-up, hairpins, clips or nail polish.  Do not wear lotions, powders, or perfumes. You may wear deodorant.  Do not shave 48 hours prior to surgery. Men may shave face and neck.  Do not bring valuables to the hospital.    [facility] is not responsible for any belongings or valuables.               Contacts, dentures or bridgework may not be worn into surgery.  Leave your suitcase in the car. After surgery it may be brought to your room.  For patients admitted to the hospital, discharge time is determined by your                treatment team.   Patients discharged the day of surgery will not be allowed to drive home.   Please read over the following fact sheets that you were given:   Surgical Site Infection Prevention   ____ Take these medicines the morning of surgery with A SIP OF WATER:    1. NIFEDIPINE  2. PROTONIX  3. QUINAPRIL  4.  5.  6.  ____ Fleet Enema (as directed)   __X__ Use CHG Soap as directed  ____ Use inhalers on the day of surgery  ____ Stop metformin 2 days prior to surgery    ____ Take 1/2 of usual insulin dose the night before surgery and none on the morning of surgery.   ____ Stop  Coumadin/Plavix/aspirin on ____ Stop Anti-inflammatories on    ____ Stop supplements until after surgery.    ____ Bring C-Pap to the hospital.

## 2015-08-11 ENCOUNTER — Inpatient Hospital Stay: Payer: Medicare PPO

## 2015-08-11 ENCOUNTER — Inpatient Hospital Stay: Payer: Medicare PPO | Attending: Oncology

## 2015-08-11 ENCOUNTER — Encounter: Payer: Self-pay | Admitting: *Deleted

## 2015-08-11 VITALS — BP 142/77 | HR 80 | Temp 97.0°F | Resp 20

## 2015-08-11 DIAGNOSIS — D509 Iron deficiency anemia, unspecified: Secondary | ICD-10-CM

## 2015-08-11 DIAGNOSIS — Z79899 Other long term (current) drug therapy: Secondary | ICD-10-CM | POA: Insufficient documentation

## 2015-08-11 LAB — SAMPLE TO BLOOD BANK

## 2015-08-11 LAB — CBC WITH DIFFERENTIAL/PLATELET
Basophils Absolute: 0 10*3/uL (ref 0–0.1)
Basophils Relative: 0 %
EOS PCT: 0 %
Eosinophils Absolute: 0 10*3/uL (ref 0–0.7)
HCT: 24.7 % — ABNORMAL LOW (ref 40.0–52.0)
Hemoglobin: 7.8 g/dL — ABNORMAL LOW (ref 13.0–18.0)
LYMPHS ABS: 0.7 10*3/uL — AB (ref 1.0–3.6)
LYMPHS PCT: 5 %
MCH: 20.1 pg — AB (ref 26.0–34.0)
MCHC: 31.5 g/dL — ABNORMAL LOW (ref 32.0–36.0)
MCV: 63.9 fL — AB (ref 80.0–100.0)
MONO ABS: 1.2 10*3/uL — AB (ref 0.2–1.0)
Monocytes Relative: 8 %
Neutro Abs: 13.1 10*3/uL — ABNORMAL HIGH (ref 1.4–6.5)
Neutrophils Relative %: 87 %
PLATELETS: 480 10*3/uL — AB (ref 150–440)
RBC: 3.86 MIL/uL — AB (ref 4.40–5.90)
RDW: 27.9 % — ABNORMAL HIGH (ref 11.5–14.5)
WBC: 15.1 10*3/uL — AB (ref 3.8–10.6)

## 2015-08-11 LAB — PREPARE RBC (CROSSMATCH)

## 2015-08-11 MED ORDER — DIPHENHYDRAMINE HCL 50 MG/ML IJ SOLN
25.0000 mg | Freq: Once | INTRAMUSCULAR | Status: AC
  Administered 2015-08-11: 25 mg via INTRAVENOUS
  Filled 2015-08-11: qty 1

## 2015-08-11 MED ORDER — ACETAMINOPHEN 325 MG PO TABS
650.0000 mg | ORAL_TABLET | Freq: Once | ORAL | Status: AC
  Administered 2015-08-11: 650 mg via ORAL
  Filled 2015-08-11: qty 2

## 2015-08-11 MED ORDER — SODIUM CHLORIDE 0.9 % IV SOLN
INTRAVENOUS | Status: DC
  Administered 2015-08-11: 10:00:00 via INTRAVENOUS
  Filled 2015-08-11: qty 1000

## 2015-08-12 ENCOUNTER — Inpatient Hospital Stay: Payer: Medicare PPO

## 2015-08-12 DIAGNOSIS — D509 Iron deficiency anemia, unspecified: Secondary | ICD-10-CM

## 2015-08-12 LAB — TYPE AND SCREEN
ABO/RH(D): A NEG
ANTIBODY SCREEN: NEGATIVE
UNIT DIVISION: 0
UNIT DIVISION: 0

## 2015-08-12 LAB — CBC WITH DIFFERENTIAL/PLATELET
BASOS ABS: 0.1 10*3/uL (ref 0–0.1)
Basophils Relative: 0 %
EOS ABS: 0.1 10*3/uL (ref 0–0.7)
EOS PCT: 0 %
HCT: 29.8 % — ABNORMAL LOW (ref 40.0–52.0)
Hemoglobin: 9.3 g/dL — ABNORMAL LOW (ref 13.0–18.0)
Lymphocytes Relative: 5 %
Lymphs Abs: 0.8 10*3/uL — ABNORMAL LOW (ref 1.0–3.6)
MCH: 21.2 pg — ABNORMAL LOW (ref 26.0–34.0)
MCHC: 31.3 g/dL — ABNORMAL LOW (ref 32.0–36.0)
MCV: 67.8 fL — ABNORMAL LOW (ref 80.0–100.0)
Monocytes Absolute: 1.3 10*3/uL — ABNORMAL HIGH (ref 0.2–1.0)
Monocytes Relative: 9 %
Neutro Abs: 12.9 10*3/uL — ABNORMAL HIGH (ref 1.4–6.5)
Neutrophils Relative %: 86 %
PLATELETS: 490 10*3/uL — AB (ref 150–440)
RBC: 4.4 MIL/uL (ref 4.40–5.90)
RDW: 29.3 % — AB (ref 11.5–14.5)
WBC: 15.1 10*3/uL — AB (ref 3.8–10.6)

## 2015-08-13 ENCOUNTER — Encounter: Payer: Self-pay | Admitting: *Deleted

## 2015-08-13 ENCOUNTER — Inpatient Hospital Stay: Payer: Medicare PPO | Admitting: Anesthesiology

## 2015-08-13 ENCOUNTER — Inpatient Hospital Stay
Admission: RE | Admit: 2015-08-13 | Discharge: 2015-08-29 | DRG: 330 | Disposition: A | Discharge status: Home Health | Payer: Medicare PPO | Source: Ambulatory Visit | Attending: Surgery | Admitting: Surgery

## 2015-08-13 ENCOUNTER — Encounter
Admission: RE | Disposition: A | Discharge status: Home Health | Payer: Self-pay | Source: Ambulatory Visit | Attending: Surgery

## 2015-08-13 DIAGNOSIS — N179 Acute kidney failure, unspecified: Secondary | ICD-10-CM | POA: Diagnosis not present

## 2015-08-13 DIAGNOSIS — F17210 Nicotine dependence, cigarettes, uncomplicated: Secondary | ICD-10-CM | POA: Diagnosis present

## 2015-08-13 DIAGNOSIS — D72829 Elevated white blood cell count, unspecified: Secondary | ICD-10-CM | POA: Diagnosis not present

## 2015-08-13 DIAGNOSIS — Z79899 Other long term (current) drug therapy: Secondary | ICD-10-CM

## 2015-08-13 DIAGNOSIS — R778 Other specified abnormalities of plasma proteins: Secondary | ICD-10-CM

## 2015-08-13 DIAGNOSIS — I129 Hypertensive chronic kidney disease with stage 1 through stage 4 chronic kidney disease, or unspecified chronic kidney disease: Secondary | ICD-10-CM | POA: Diagnosis present

## 2015-08-13 DIAGNOSIS — D509 Iron deficiency anemia, unspecified: Secondary | ICD-10-CM | POA: Diagnosis present

## 2015-08-13 DIAGNOSIS — Z7982 Long term (current) use of aspirin: Secondary | ICD-10-CM

## 2015-08-13 DIAGNOSIS — Y838 Other surgical procedures as the cause of abnormal reaction of the patient, or of later complication, without mention of misadventure at the time of the procedure: Secondary | ICD-10-CM | POA: Diagnosis not present

## 2015-08-13 DIAGNOSIS — K56609 Unspecified intestinal obstruction, unspecified as to partial versus complete obstruction: Secondary | ICD-10-CM

## 2015-08-13 DIAGNOSIS — C779 Secondary and unspecified malignant neoplasm of lymph node, unspecified: Secondary | ICD-10-CM | POA: Diagnosis present

## 2015-08-13 DIAGNOSIS — E119 Type 2 diabetes mellitus without complications: Secondary | ICD-10-CM

## 2015-08-13 DIAGNOSIS — Z9889 Other specified postprocedural states: Secondary | ICD-10-CM

## 2015-08-13 DIAGNOSIS — I248 Other forms of acute ischemic heart disease: Secondary | ICD-10-CM | POA: Diagnosis present

## 2015-08-13 DIAGNOSIS — K3184 Gastroparesis: Secondary | ICD-10-CM | POA: Diagnosis not present

## 2015-08-13 DIAGNOSIS — R0600 Dyspnea, unspecified: Secondary | ICD-10-CM | POA: Diagnosis not present

## 2015-08-13 DIAGNOSIS — R7989 Other specified abnormal findings of blood chemistry: Secondary | ICD-10-CM

## 2015-08-13 DIAGNOSIS — J439 Emphysema, unspecified: Secondary | ICD-10-CM | POA: Diagnosis present

## 2015-08-13 DIAGNOSIS — C182 Malignant neoplasm of ascending colon: Secondary | ICD-10-CM | POA: Diagnosis present

## 2015-08-13 DIAGNOSIS — E1122 Type 2 diabetes mellitus with diabetic chronic kidney disease: Secondary | ICD-10-CM | POA: Diagnosis present

## 2015-08-13 DIAGNOSIS — R1110 Vomiting, unspecified: Secondary | ICD-10-CM

## 2015-08-13 DIAGNOSIS — M1990 Unspecified osteoarthritis, unspecified site: Secondary | ICD-10-CM | POA: Diagnosis present

## 2015-08-13 DIAGNOSIS — E785 Hyperlipidemia, unspecified: Secondary | ICD-10-CM | POA: Diagnosis present

## 2015-08-13 DIAGNOSIS — C787 Secondary malignant neoplasm of liver and intrahepatic bile duct: Secondary | ICD-10-CM | POA: Diagnosis present

## 2015-08-13 DIAGNOSIS — E1165 Type 2 diabetes mellitus with hyperglycemia: Secondary | ICD-10-CM | POA: Diagnosis present

## 2015-08-13 DIAGNOSIS — K913 Postprocedural intestinal obstruction: Secondary | ICD-10-CM | POA: Diagnosis not present

## 2015-08-13 DIAGNOSIS — I493 Ventricular premature depolarization: Secondary | ICD-10-CM | POA: Diagnosis not present

## 2015-08-13 DIAGNOSIS — E860 Dehydration: Secondary | ICD-10-CM | POA: Diagnosis not present

## 2015-08-13 DIAGNOSIS — E871 Hypo-osmolality and hyponatremia: Secondary | ICD-10-CM | POA: Diagnosis not present

## 2015-08-13 DIAGNOSIS — Z794 Long term (current) use of insulin: Secondary | ICD-10-CM

## 2015-08-13 DIAGNOSIS — R0602 Shortness of breath: Secondary | ICD-10-CM

## 2015-08-13 DIAGNOSIS — N183 Chronic kidney disease, stage 3 unspecified: Secondary | ICD-10-CM | POA: Diagnosis present

## 2015-08-13 DIAGNOSIS — I10 Essential (primary) hypertension: Secondary | ICD-10-CM | POA: Diagnosis present

## 2015-08-13 DIAGNOSIS — C189 Malignant neoplasm of colon, unspecified: Secondary | ICD-10-CM | POA: Diagnosis present

## 2015-08-13 HISTORY — DX: Iron deficiency anemia, unspecified: D50.9

## 2015-08-13 HISTORY — DX: Secondary malignant neoplasm of liver and intrahepatic bile duct: C18.9

## 2015-08-13 HISTORY — DX: Secondary malignant neoplasm of liver and intrahepatic bile duct: C78.7

## 2015-08-13 HISTORY — PX: LAPAROSCOPIC RIGHT COLECTOMY: SHX5925

## 2015-08-13 HISTORY — DX: Chronic kidney disease, stage 3 unspecified: N18.30

## 2015-08-13 HISTORY — DX: Chronic kidney disease, stage 3 (moderate): N18.3

## 2015-08-13 LAB — GLUCOSE, CAPILLARY
GLUCOSE-CAPILLARY: 190 mg/dL — AB (ref 65–99)
Glucose-Capillary: 171 mg/dL — ABNORMAL HIGH (ref 65–99)
Glucose-Capillary: 219 mg/dL — ABNORMAL HIGH (ref 65–99)

## 2015-08-13 LAB — HEMOGLOBIN: HEMOGLOBIN: 9.8 g/dL — AB (ref 13.0–18.0)

## 2015-08-13 LAB — POTASSIUM: POTASSIUM: 3 mmol/L — AB (ref 3.5–5.1)

## 2015-08-13 LAB — CREATININE, SERUM
CREATININE: 1.07 mg/dL (ref 0.61–1.24)
GFR calc Af Amer: >60 mL/min (ref >60)

## 2015-08-13 LAB — TYPE AND SCREEN
ABO/RH(D): A NEG
Antibody Screen: NEGATIVE

## 2015-08-13 SURGERY — COLECTOMY, RIGHT, LAPAROSCOPIC
Anesthesia: General | Wound class: Clean Contaminated

## 2015-08-13 MED ORDER — ACETAMINOPHEN 325 MG PO TABS
650.0000 mg | ORAL_TABLET | ORAL | Status: DC | PRN
  Administered 2015-08-15 – 2015-08-19 (×3): 650 mg via ORAL
  Filled 2015-08-13 (×3): qty 2

## 2015-08-13 MED ORDER — ENOXAPARIN SODIUM 40 MG/0.4ML ~~LOC~~ SOLN
40.0000 mg | SUBCUTANEOUS | Status: DC
  Administered 2015-08-14 – 2015-08-23 (×10): 40 mg via SUBCUTANEOUS
  Filled 2015-08-13 (×11): qty 0.4

## 2015-08-13 MED ORDER — ACETAMINOPHEN 10 MG/ML IV SOLN
INTRAVENOUS | Status: DC | PRN
  Administered 2015-08-13: 1000 mg via INTRAVENOUS

## 2015-08-13 MED ORDER — PHENYLEPHRINE HCL 10 MG/ML IJ SOLN
INTRAMUSCULAR | Status: DC | PRN
  Administered 2015-08-13 (×2): 50 ug via INTRAVENOUS

## 2015-08-13 MED ORDER — ACETAMINOPHEN 650 MG RE SUPP: 650.0000 mg | Freq: Four times a day (QID) | RECTAL | Status: DC | PRN

## 2015-08-13 MED ORDER — PRAVASTATIN SODIUM 20 MG PO TABS
40.0000 mg | ORAL_TABLET | Freq: Every day | ORAL | Status: DC
  Administered 2015-08-13 – 2015-08-28 (×16): 40 mg via ORAL
  Filled 2015-08-13 (×16): qty 2

## 2015-08-13 MED ORDER — HYDROMORPHONE HCL 1 MG/ML IJ SOLN: 0.2500 mg | INTRAMUSCULAR | Status: DC | PRN

## 2015-08-13 MED ORDER — SENNA 8.6 MG PO TABS
1.0000 | ORAL_TABLET | Freq: Two times a day (BID) | ORAL | Status: DC
  Administered 2015-08-14 – 2015-08-19 (×11): 8.6 mg via ORAL
  Filled 2015-08-13 (×11): qty 1

## 2015-08-13 MED ORDER — GLYCOPYRROLATE 0.2 MG/ML IJ SOLN
INTRAMUSCULAR | Status: DC | PRN
  Administered 2015-08-13: .5 mg via INTRAVENOUS

## 2015-08-13 MED ORDER — LIDOCAINE HCL (CARDIAC) 20 MG/ML IV SOLN
INTRAVENOUS | Status: DC | PRN
  Administered 2015-08-13: 60 mg via INTRAVENOUS

## 2015-08-13 MED ORDER — POTASSIUM CHLORIDE CRYS ER 20 MEQ PO TBCR
40.0000 meq | EXTENDED_RELEASE_TABLET | Freq: Two times a day (BID) | ORAL | Status: DC
  Administered 2015-08-13 – 2015-08-14 (×3): 40 meq via ORAL
  Filled 2015-08-13 (×5): qty 2

## 2015-08-13 MED ORDER — KCL IN DEXTROSE-NACL 20-5-0.2 MEQ/L-%-% IV SOLN
INTRAVENOUS | Status: DC
  Administered 2015-08-13 – 2015-08-17 (×7): via INTRAVENOUS
  Filled 2015-08-13 (×11): qty 1000

## 2015-08-13 MED ORDER — ENOXAPARIN SODIUM 30 MG/0.3ML ~~LOC~~ SOLN
30.0000 mg | Freq: Once | SUBCUTANEOUS | Status: AC
  Administered 2015-08-13: 30 mg via SUBCUTANEOUS
  Filled 2015-08-13: qty 0.3

## 2015-08-13 MED ORDER — INSULIN ASPART 100 UNIT/ML ~~LOC~~ SOLN
0.0000 [IU] | Freq: Three times a day (TID) | SUBCUTANEOUS | Status: DC
  Administered 2015-08-14 – 2015-08-15 (×4): 7 [IU] via SUBCUTANEOUS
  Administered 2015-08-15: 5 [IU] via SUBCUTANEOUS
  Administered 2015-08-15 – 2015-08-16 (×2): 3 [IU] via SUBCUTANEOUS
  Administered 2015-08-16 (×2): 9 [IU] via SUBCUTANEOUS
  Administered 2015-08-17: 2 [IU] via SUBCUTANEOUS
  Administered 2015-08-17: 3 [IU] via SUBCUTANEOUS
  Administered 2015-08-17 – 2015-08-18 (×2): 2 [IU] via SUBCUTANEOUS
  Administered 2015-08-18: 1 [IU] via SUBCUTANEOUS
  Administered 2015-08-18: 3 [IU] via SUBCUTANEOUS
  Administered 2015-08-20 – 2015-08-22 (×6): 2 [IU] via SUBCUTANEOUS
  Administered 2015-08-22: 1 [IU] via SUBCUTANEOUS
  Administered 2015-08-22 – 2015-08-23 (×3): 2 [IU] via SUBCUTANEOUS
  Administered 2015-08-24: 3 [IU] via SUBCUTANEOUS
  Administered 2015-08-24 – 2015-08-28 (×11): 1 [IU] via SUBCUTANEOUS
  Administered 2015-08-28: 2 [IU] via SUBCUTANEOUS
  Administered 2015-08-28: 1 [IU] via SUBCUTANEOUS
  Filled 2015-08-13: qty 4
  Filled 2015-08-13: qty 2
  Filled 2015-08-13: qty 1
  Filled 2015-08-13: qty 2
  Filled 2015-08-13: qty 3
  Filled 2015-08-13 (×2): qty 2
  Filled 2015-08-13 (×3): qty 1
  Filled 2015-08-13 (×2): qty 3
  Filled 2015-08-13: qty 2
  Filled 2015-08-13: qty 7
  Filled 2015-08-13: qty 3
  Filled 2015-08-13: qty 7
  Filled 2015-08-13 (×3): qty 2
  Filled 2015-08-13 (×3): qty 1
  Filled 2015-08-13: qty 3
  Filled 2015-08-13 (×2): qty 2
  Filled 2015-08-13: qty 1
  Filled 2015-08-13: qty 7
  Filled 2015-08-13 (×3): qty 1
  Filled 2015-08-13: qty 7
  Filled 2015-08-13: qty 1
  Filled 2015-08-13 (×2): qty 2
  Filled 2015-08-13: qty 9
  Filled 2015-08-13: qty 1
  Filled 2015-08-13: qty 5
  Filled 2015-08-13: qty 2
  Filled 2015-08-13: qty 3

## 2015-08-13 MED ORDER — ONDANSETRON HCL 4 MG/2ML IJ SOLN
4.0000 mg | Freq: Four times a day (QID) | INTRAMUSCULAR | Status: DC | PRN
  Administered 2015-08-18 – 2015-08-19 (×3): 4 mg via INTRAVENOUS
  Filled 2015-08-13 (×3): qty 2

## 2015-08-13 MED ORDER — ONDANSETRON HCL 4 MG/2ML IJ SOLN: 4.0000 mg | Freq: Once | INTRAMUSCULAR | Status: DC | PRN

## 2015-08-13 MED ORDER — FENTANYL CITRATE (PF) 100 MCG/2ML IJ SOLN: 25.0000 ug | INTRAMUSCULAR | Status: DC | PRN

## 2015-08-13 MED ORDER — QUINAPRIL HCL 10 MG PO TABS: 40.0000 mg | ORAL_TABLET | Freq: Every day | ORAL | Status: DC

## 2015-08-13 MED ORDER — LISINOPRIL 20 MG PO TABS
40.0000 mg | ORAL_TABLET | Freq: Every day | ORAL | Status: DC
  Administered 2015-08-14 – 2015-08-29 (×15): 40 mg via ORAL
  Filled 2015-08-13 (×16): qty 2

## 2015-08-13 MED ORDER — ONDANSETRON HCL 4 MG/2ML IJ SOLN
INTRAMUSCULAR | Status: DC | PRN
  Administered 2015-08-13: 4 mg via INTRAVENOUS

## 2015-08-13 MED ORDER — NIFEDIPINE ER OSMOTIC RELEASE 90 MG PO TB24
90.0000 mg | ORAL_TABLET | Freq: Every day | ORAL | Status: DC
  Administered 2015-08-14 – 2015-08-29 (×15): 90 mg via ORAL
  Filled 2015-08-13 (×18): qty 1

## 2015-08-13 MED ORDER — LACTATED RINGERS IV SOLN: INTRAVENOUS | Status: DC

## 2015-08-13 MED ORDER — PANTOPRAZOLE SODIUM 40 MG PO TBEC
40.0000 mg | DELAYED_RELEASE_TABLET | Freq: Two times a day (BID) | ORAL | Status: DC
  Administered 2015-08-13 – 2015-08-19 (×12): 40 mg via ORAL
  Filled 2015-08-13 (×12): qty 1

## 2015-08-13 MED ORDER — FENTANYL CITRATE (PF) 100 MCG/2ML IJ SOLN
INTRAMUSCULAR | Status: DC | PRN
  Administered 2015-08-13: 100 ug via INTRAVENOUS
  Administered 2015-08-13: 50 ug via INTRAVENOUS

## 2015-08-13 MED ORDER — ONDANSETRON 4 MG PO TBDP
4.0000 mg | ORAL_TABLET | Freq: Four times a day (QID) | ORAL | Status: DC | PRN
  Administered 2015-08-16 – 2015-08-19 (×3): 4 mg via ORAL
  Filled 2015-08-13 (×3): qty 1

## 2015-08-13 MED ORDER — LACTATED RINGERS IV SOLN
INTRAVENOUS | Status: DC | PRN
  Administered 2015-08-13: 15:00:00 via INTRAVENOUS

## 2015-08-13 MED ORDER — PROPOFOL 10 MG/ML IV BOLUS
INTRAVENOUS | Status: DC | PRN
  Administered 2015-08-13: 150 mg via INTRAVENOUS

## 2015-08-13 MED ORDER — NEOSTIGMINE METHYLSULFATE 10 MG/10ML IV SOLN
INTRAVENOUS | Status: DC | PRN
  Administered 2015-08-13: 3 mg via INTRAVENOUS

## 2015-08-13 MED ORDER — ENOXAPARIN SODIUM 30 MG/0.3ML ~~LOC~~ SOLN: 30.0000 mg | SUBCUTANEOUS | Status: DC

## 2015-08-13 MED ORDER — INSULIN ASPART 100 UNIT/ML ~~LOC~~ SOLN
0.0000 [IU] | Freq: Every day | SUBCUTANEOUS | Status: DC
  Administered 2015-08-13: 2 [IU] via SUBCUTANEOUS
  Administered 2015-08-14: 4 [IU] via SUBCUTANEOUS
  Administered 2015-08-16: 2 [IU] via SUBCUTANEOUS
  Filled 2015-08-13: qty 4
  Filled 2015-08-13: qty 2
  Filled 2015-08-13: qty 1
  Filled 2015-08-13: qty 2

## 2015-08-13 MED ORDER — SODIUM CHLORIDE 0.9 % IV SOLN
INTRAVENOUS | Status: DC
  Administered 2015-08-13: 11:00:00 via INTRAVENOUS

## 2015-08-13 MED ORDER — DEXTROSE 5 % IV SOLN
2.0000 g | INTRAVENOUS | Status: AC
  Administered 2015-08-13: 2 g via INTRAVENOUS
  Filled 2015-08-13: qty 2

## 2015-08-13 MED ORDER — ESMOLOL HCL 10 MG/ML IV SOLN
INTRAVENOUS | Status: DC | PRN
  Administered 2015-08-13: 20 mg via INTRAVENOUS

## 2015-08-13 MED ORDER — HYDROCODONE-ACETAMINOPHEN 5-325 MG PO TABS
1.0000 | ORAL_TABLET | ORAL | Status: DC | PRN
  Administered 2015-08-14: 2 via ORAL
  Administered 2015-08-16: 1 via ORAL
  Administered 2015-08-16: 2 via ORAL
  Filled 2015-08-13 (×2): qty 2
  Filled 2015-08-13: qty 1

## 2015-08-13 MED ORDER — ROCURONIUM BROMIDE 100 MG/10ML IV SOLN
INTRAVENOUS | Status: DC | PRN
  Administered 2015-08-13 (×2): 10 mg via INTRAVENOUS
  Administered 2015-08-13: 40 mg via INTRAVENOUS
  Administered 2015-08-13: 10 mg via INTRAVENOUS

## 2015-08-13 MED ORDER — MORPHINE SULFATE (PF) 2 MG/ML IV SOLN
1.0000 mg | INTRAVENOUS | Status: DC | PRN
  Administered 2015-08-13 – 2015-08-25 (×5): 2 mg via INTRAVENOUS
  Filled 2015-08-13: qty 1
  Filled 2015-08-13: qty 2
  Filled 2015-08-13 (×3): qty 1

## 2015-08-13 MED ORDER — ACETAMINOPHEN 10 MG/ML IV SOLN
INTRAVENOUS | Status: AC
  Filled 2015-08-13: qty 100

## 2015-08-13 SURGICAL SUPPLY — 63 items
BLADE SURG SZ10 CARB STEEL (BLADE) ×6 IMPLANT
CANISTER SUCT 1200ML W/VALVE (MISCELLANEOUS) ×3 IMPLANT
CANNULA DILATOR 10 W/SLV (CANNULA) ×2 IMPLANT
CANNULA DILATOR 10MM W/SLV (CANNULA) ×1
CANNULA DILATOR 12 W/SLV (CANNULA) IMPLANT
CANNULA DILATOR 12MM W/SLV (CANNULA)
CATH TRAY 16F METER LATEX (MISCELLANEOUS) ×3 IMPLANT
CHLORAPREP W/TINT 26ML (MISCELLANEOUS) ×3 IMPLANT
CLEANER CAUTERY TIP 5X5 PAD (MISCELLANEOUS) ×2 IMPLANT
CLIP TI LARGE 6 (CLIP) IMPLANT
CLIP TI MEDIUM 6 (CLIP) IMPLANT
CLOSURE WOUND 1/2 X4 (GAUZE/BANDAGES/DRESSINGS)
DEVICE HAND ACCESS DEXTUS (MISCELLANEOUS) IMPLANT
DRAPE LEGGINS SURG 28X43 STRL (DRAPES) IMPLANT
DRAPE UNDER BUTTOCK W/FLU (DRAPES) IMPLANT
DRSG OPSITE POSTOP 4X10 (GAUZE/BANDAGES/DRESSINGS) IMPLANT
GAUZE SPONGE 4X4 12PLY STRL (GAUZE/BANDAGES/DRESSINGS) ×3 IMPLANT
GLOVE BIO SURGEON STRL SZ7.5 (GLOVE) ×6 IMPLANT
GOWN STRL REUS W/ TWL LRG LVL3 (GOWN DISPOSABLE) ×4 IMPLANT
GOWN STRL REUS W/TWL LRG LVL3 (GOWN DISPOSABLE) ×8
HANDLE YANKAUER SUCT BULB TIP (MISCELLANEOUS) ×3 IMPLANT
IRRIGATION STRYKERFLOW (MISCELLANEOUS) IMPLANT
IRRIGATOR STRYKERFLOW (MISCELLANEOUS)
IV NS 1000ML (IV SOLUTION)
IV NS 1000ML BAXH (IV SOLUTION) IMPLANT
KIT RM TURNOVER STRD PROC AR (KITS) ×3 IMPLANT
LABEL OR SOLS (LABEL) ×3 IMPLANT
NDL INSUFF ACCESS 14 VERSASTEP (NEEDLE) ×3 IMPLANT
NS IRRIG 500ML POUR BTL (IV SOLUTION) ×3 IMPLANT
PACK COLON CLEAN CLOSURE (MISCELLANEOUS) ×3 IMPLANT
PACK LAP CHOLECYSTECTOMY (MISCELLANEOUS) ×3 IMPLANT
PAD CLEANER CAUTERY TIP 5X5 (MISCELLANEOUS) ×4
PAD GROUND ADULT SPLIT (MISCELLANEOUS) ×3 IMPLANT
PAD PREP 24X41 OB/GYN DISP (PERSONAL CARE ITEMS) IMPLANT
PENCIL ELECTRO HAND CTR (MISCELLANEOUS) ×6 IMPLANT
PROT DEXTUS HAND ACCESS (MISCELLANEOUS)
RETRACT M ACCESS DEXTRUS (MISCELLANEOUS)
RETRACTOR FIXED LENGTH SML (MISCELLANEOUS) IMPLANT
RETRACTOR M ACCESS DEXTRUS (MISCELLANEOUS) IMPLANT
SCISSORS METZENBAUM CVD 33 (INSTRUMENTS) IMPLANT
SEAL FOR SCOPE WARMER C3101 (MISCELLANEOUS) ×3 IMPLANT
SHEARS HARMONIC ACE PLUS 36CM (ENDOMECHANICALS) ×3 IMPLANT
SOL PREP PVP 2OZ (MISCELLANEOUS)
SOLUTION PREP PVP 2OZ (MISCELLANEOUS) IMPLANT
SPONGE LAP 18X18 5 PK (GAUZE/BANDAGES/DRESSINGS) ×3 IMPLANT
STAPLER GUN LINEAR PROX 60 (STAPLE) ×3 IMPLANT
STAPLER PROXIMATE 75MM BLUE (STAPLE) ×3 IMPLANT
STRIP CLOSURE SKIN 1/2X4 (GAUZE/BANDAGES/DRESSINGS) IMPLANT
SURGILUBE 2OZ TUBE FLIPTOP (MISCELLANEOUS) IMPLANT
SUT CHROMIC 0 SH (SUTURE) ×3 IMPLANT
SUT CHROMIC 3 0 SH 27 (SUTURE) IMPLANT
SUT ETHILON 4-0 (SUTURE) ×4
SUT ETHILON 4-0 FS2 18XMFL BLK (SUTURE) ×2
SUT MAXON ABS #0 GS21 30IN (SUTURE) ×6 IMPLANT
SUT NYLON 2-0 (SUTURE) IMPLANT
SUT PROLENE 3 0 SH DA (SUTURE) IMPLANT
SUT VIC AB 5-0 RB1 27 (SUTURE) ×3 IMPLANT
SUTURE ETHLN 4-0 FS2 18XMF BLK (SUTURE) ×2 IMPLANT
TROCAR XCEL NON-BLD 11X100MML (ENDOMECHANICALS) ×3 IMPLANT
TROCAR XCEL NON-BLD 5MMX100MML (ENDOMECHANICALS) ×3 IMPLANT
TROCAR XCEL UNIV SLVE 11M 100M (ENDOMECHANICALS) ×6 IMPLANT
TUBING INSUFFLATOR HEATED (MISCELLANEOUS) ×3 IMPLANT
WATER STERILE IRR 1000ML POUR (IV SOLUTION) ×3 IMPLANT

## 2015-08-13 NOTE — Transfer of Care (Signed)
Immediate Anesthesia Transfer of Care Note  Patient: Aaron Allen  Procedure(s) Performed: Procedure(s): LAPAROSCOPIC RIGHT COLECTOMY (N/A)  Patient Location: PACU  Anesthesia Type:General  Level of Consciousness: sedated  Airway & Oxygen Therapy: Patient Spontanous Breathing and Patient connected to face mask oxygen  Post-op Assessment: Report given to RN  Post vital signs: Reviewed and stable  Last Vitals:  Filed Vitals:   08/13/15 1754  BP: 136/64  Pulse: 80  Temp: 37.9 C  Resp: 13    Complications: No apparent anesthesia complications

## 2015-08-13 NOTE — OR Nursing (Signed)
Dr Andree Elk notified of am labs will come to see patient. No new orders at this time.

## 2015-08-13 NOTE — Anesthesia Postprocedure Evaluation (Signed)
  Anesthesia Post-op Note  Patient: Aaron Allen  Procedure(s) Performed: Procedure(s): LAPAROSCOPIC RIGHT COLECTOMY (N/A)  Anesthesia type:No value filed.  Patient location: PACU  Post pain: Pain level controlled  Post assessment: Post-op Vital signs reviewed, Patient's Cardiovascular Status Stable, Respiratory Function Stable, Patent Airway and No signs of Nausea or vomiting  Post vital signs: Reviewed and stable  Last Vitals:  Filed Vitals:   08/13/15 2049  BP: 143/72  Pulse: 72  Temp: 36.6 C  Resp:     Level of consciousness: awake, alert  and patient cooperative  Complications: No apparent anesthesia complications

## 2015-08-13 NOTE — Op Note (Signed)
OPERATIVE REPORT  PREOPERATIVE  DIAGNOSIS: . Carcinoma of the right colon  POSTOPERATIVE DIAGNOSIS: Carcinoma the right colon.  PROCEDURE: . Laparoscopic right colectomy  ANESTHESIA:  General  SURGEON: Rochel Brome  MD   INDICATIONS: . He had recent findings of anemia. He had colonoscopy demonstrating cancer of the right colon. CT scan demonstrated multiple hepatic metastasis. Surgery was recommended to help resolve bleeding and prevent obstruction  With the patient on the operating table in the supine position he was placed under general endotracheal anesthesia. The abdomen was prepared with ChloraPrep and draped in a sterile manner  A short incision was made below the umbilicus to insert a Veress needle with insufflation of the abdomen with carbon dioxide. The 10 mm cannula was inserted. The 10 mm 0 laparoscope was inserted to view the peritoneal cavity there were multiple liver metastasis identified which appeared to range in size from 1-2 cm. These were seen in both lobes of the liver. The bladder was full of urine and elected to insert a Foley urinary catheter after the procedure.. 2 incisions were made in the epigastrium to insert 11 mm cannulas. Another incision was made in the right midabdomen to introduce a 5 mm cannula  The right colon was mobilized with incision of the lateral peritoneal reflection using the Harmonic scalpel. The terminal ileum and appendix were mobilized as well. The omentum was dissected away from the right transverse colon and the right transverse colon was mobilized with Harmonic scalpel.. The laparoscope was changed to use the angle scope. Duodenum was identified. After satisfactory mobilization of the right colon the laparoscopic ports were removed. An incision was made from one epigastric port site to to the other. Electrocautery was used for hemostasis and dissected down to incised the linea alba and the peritoneum... The colon was brought out on the abdominal  wall. The middle colic artery was identified. The mesenteric dissection was began with the Harmonic scalpel just to the right of the middle colic vessels. A window was created in the mesentery of the small bowel 4 inches proximal to the ileocecal valve and began the dissection of the mesentery that point. The ileocolic artery and vein were doubly ligated with 0 chromic and divided with the Harmonic scalpel. Another large was divided in a similar manner. The small bowel was placed beside the transverse colon. An enterotomy and a colotomy were made. The anastomosis was begun with the insertion of the 75 mm GIA stapler was engaged and activated. The staple line was hemostatic. The anastomosis was completed with the application of the TA 60 stapler which was placed perpendicular to the first. The specimen was excised and passed to a side table. It was later opened demonstrating the circumferential tumor and multiple tablets were found in the cecum. The anastomosis was inspected and found to be widely patent. Several small bleeding points were cauterized. 2 bleeding points were ligated with 5-0 Vicryl. The apex of the staple line was imbricated with 5-0 Vicryl. The mesenteric defect was closed with running 3-0 chromic stitch.  Gloves gowns sheets and instruments were changed the wound was further inspected the abdomen was irrigated with warm saline solution and aspirated area and hemostasis was intact. The midline fascia was closed with interrupted 0 Maxon figure-of-eight sutures. The skin was closed with interrupted 4-0 nylon vertical mattress sutures. Dressings were applied with paper tape. The patient appeared to tolerate the procedure satisfactorily.  Circulating nurse inserted a Foley urinary catheter draining 450 cc of urine. The patient  was prepared for transfer to the recovery room  [facility].D.

## 2015-08-13 NOTE — Anesthesia Procedure Notes (Signed)
Procedure Name: Intubation Date/Time: 08/13/2015 2:13 PM Performed by: Dionne Bucy Pre-anesthesia Checklist: Patient identified, Patient being monitored, Timeout performed, Emergency Drugs available and Suction available Patient Re-evaluated:Patient Re-evaluated prior to inductionOxygen Delivery Method: Circle system utilized Preoxygenation: Pre-oxygenation with 100% oxygen Intubation Type: IV induction Ventilation: Mask ventilation without difficulty Laryngoscope Size: Mac and 4 Grade View: Grade I Tube type: Oral Tube size: 7.5 mm Number of attempts: 1 Airway Equipment and Method: Stylet Placement Confirmation: ETT inserted through vocal cords under direct vision,  positive ETCO2 and breath sounds checked- equal and bilateral Secured at: 22 cm Tube secured with: Tape Dental Injury: Teeth and Oropharynx as per pre-operative assessment

## 2015-08-13 NOTE — Progress Notes (Signed)
ANTICOAGULATION CONSULT NOTE - Initial Consult  Pharmacy Consult for Lovenox Indication: VTE prophylaxis  No Known Allergies  Patient Measurements: Height: 5\' 9"  (175.3 cm) Weight: 173 lb (78.472 kg) IBW/kg (Calculated) : 70.7 Heparin Dosing Weight:   Vital Signs: Temp: 97.9 F (36.6 C) (09/09 1933) Temp Source: Oral (09/09 1933) BP: 142/70 mmHg (09/09 1933) Pulse Rate: 123 (09/09 1933)  Labs:  Recent Labs  08/11/15 0851 08/12/15 1044 08/13/15 1040  HGB 7.8* 9.3* 9.8*  HCT 24.7* 29.8*  --   PLT 480* 490*  --   CREATININE  --   --  1.07    Estimated Creatinine Clearance: 58.7 mL/min (by C-G formula based on Cr of 1.07).   Medical History: Past Medical History  Diagnosis Date  . Diabetes mellitus without complication   . Hypertension   . HLD (hyperlipidemia)   . Arthritis   . Moderate mitral regurgitation   . Complication of anesthesia   . Chronic kidney disease   . Cancer     Colon CA  . Anemia     FOR IRON INFUSION 08/11/15 REPEAT LABS 08/12/15    Medications:  Scheduled:  . enoxaparin (LOVENOX) injection  30 mg Subcutaneous Once  . [START ON 08/14/2015] enoxaparin (LOVENOX) injection  40 mg Subcutaneous Q24H  . insulin aspart  0-5 Units Subcutaneous QHS  . [START ON 08/14/2015] insulin aspart  0-9 Units Subcutaneous TID WC  . lisinopril  40 mg Oral Daily  . NIFEdipine  90 mg Oral Daily  . pantoprazole  40 mg Oral BID  . potassium chloride  40 mEq Oral BID  . pravastatin  40 mg Oral QHS  . [START ON 08/14/2015] senna  1 tablet Oral BID    Assessment: CrCl = 58.7 ml/min  Goal of Therapy:  DVT prophylaxis    Plan:  Lovenox 30 mg SQ Q24H originally ordered.  Will adjust dose to Lovenox 30 mg SQ X 1 to be given 9/9 followed by lovenox 40 mg SQ Q24H to start 9/10 @ 22:00.  Sondos Wolfman D 08/13/2015,8:41 PM

## 2015-08-13 NOTE — Anesthesia Preprocedure Evaluation (Signed)
Anesthesia Evaluation    History of Anesthesia Complications (+) history of anesthetic complications  Airway Mallampati: II       Dental  (+) Teeth Intact   Pulmonary former smoker,           Cardiovascular hypertension,  Rhythm:regular     Neuro/Psych    GI/Hepatic   Endo/Other  diabetes  Renal/GU CRFRenal disease     Musculoskeletal   Abdominal   Peds  Hematology  (+) anemia ,   Anesthesia Other Findings   Reproductive/Obstetrics                             Anesthesia Physical Anesthesia Plan  ASA: III  Anesthesia Plan: General ETT   Post-op Pain Management:    Induction:   Airway Management Planned:   Additional Equipment:   Intra-op Plan:   Post-operative Plan:   Informed Consent:   Plan Discussed with:   Anesthesia Plan Comments:         Anesthesia Quick Evaluation

## 2015-08-13 NOTE — H&P (Signed)
  He reports no change in condition since the office visit. He has been anemic and 2 days ago received 2 blood transfusions. Hemoglobin today is 9.8. He has been taking a potassium supplement. Potassium today is 3.0  I discussed the plan for laparoscopic right colectomy. We'll plan to replace potassium

## 2015-08-14 LAB — CBC
HEMATOCRIT: 32 % — AB (ref 40.0–52.0)
HEMOGLOBIN: 9.9 g/dL — AB (ref 13.0–18.0)
MCH: 21.2 pg — AB (ref 26.0–34.0)
MCHC: 31 g/dL — ABNORMAL LOW (ref 32.0–36.0)
MCV: 68.4 fL — ABNORMAL LOW (ref 80.0–100.0)
Platelets: 467 10*3/uL — ABNORMAL HIGH (ref 150–440)
RBC: 4.68 MIL/uL (ref 4.40–5.90)
RDW: 29.7 % — ABNORMAL HIGH (ref 11.5–14.5)
WBC: 23.6 10*3/uL — ABNORMAL HIGH (ref 3.8–10.6)

## 2015-08-14 LAB — BASIC METABOLIC PANEL
Anion gap: 9 (ref 5–15)
BUN: 10 mg/dL (ref 6–20)
CHLORIDE: 100 mmol/L — AB (ref 101–111)
CO2: 26 mmol/L (ref 22–32)
Calcium: 9 mg/dL (ref 8.9–10.3)
Creatinine, Ser: 0.98 mg/dL (ref 0.61–1.24)
GFR calc non Af Amer: >60 mL/min (ref >60)
Glucose, Bld: 317 mg/dL — ABNORMAL HIGH (ref 65–99)
POTASSIUM: 4 mmol/L (ref 3.5–5.1)
SODIUM: 135 mmol/L (ref 135–145)

## 2015-08-14 LAB — GLUCOSE, CAPILLARY
GLUCOSE-CAPILLARY: 328 mg/dL — AB (ref 65–99)
Glucose-Capillary: 309 mg/dL — ABNORMAL HIGH (ref 65–99)
Glucose-Capillary: 319 mg/dL — ABNORMAL HIGH (ref 65–99)
Glucose-Capillary: 315 mg/dL — ABNORMAL HIGH (ref 65–99)

## 2015-08-14 NOTE — Progress Notes (Signed)
He reports minimal amount of incisional pain. He has had no nausea and is asking for something to drink. He is voiding satisfactorily. He has not walked yet. He has not yet received an incentive spirometer. Blood sugars have been elevated and is receiving sliding scale insulin.  Vital signs are stable. He is awake alert and oriented and in no acute distress. Lung sounds are clear. Abdomen is soft and flat with minimal tenderness. Dressings are dry.  Labs noted  Plan is to start clear liquid carbohydrate-controlled. I discussed how to do incentive spirometry. Recommended walking in the hallway. I encouraged taking Tylenol for any pain.

## 2015-08-14 NOTE — Progress Notes (Signed)
Initial Nutrition Assessment   INTERVENTION:   Coordination of Care: await diet progression as medically able Medical Food Supplement Therapy: will recommend on follow if unable to advance diet order   NUTRITION DIAGNOSIS:   Inadequate oral intake related to acute illness as evidenced by  (NPO diet order, just advanced to CL)  GOAL:   Patient will meet greater than or equal to 90% of their needs  MONITOR:    (Energy Intake, Glucose Profile, Anthropometrics, Digestive System)  REASON FOR ASSESSMENT:   Malnutrition Screening Tool    ASSESSMENT:   Pt admitted for lap right colectomy 08/13/2015 secondary to colon cancer found during colonoscopy. Per MD note, CT also consistent with hepatic metasis.   Past Medical History  Diagnosis Date  . Diabetes mellitus without complication   . Hypertension   . HLD (hyperlipidemia)   . Arthritis   . Moderate mitral regurgitation   . Complication of anesthesia   . Chronic kidney disease   . Cancer     Colon CA  . Anemia     FOR IRON INFUSION 08/11/15 REPEAT LABS 08/12/15    Diet Order:  Diet clear liquid Room service appropriate?: Yes; Fluid consistency:: Thin    Current Nutrition: Pt NPO this am, MD advanced diet order this am.  Food/Nutrition-Related History: Pt reports last meal eaten was Thursday lunch. Pt daughter reports pt has recently started back to work and has been eating only 1-2 meals per day as he gets busy working. Pt reports no supplements PTA.    Medications: D5 0.2%NS with KCl at 154mL/hr (providing 510kcals in 24 hours), Protonix, Senokot, novolog  Electrolyte/Renal Profile and Glucose Profile:   Recent Labs Lab 08/13/15 1040 08/14/15 0452  NA  --  135  K 3.0* 4.0  CL  --  100*  CO2  --  26  BUN  --  10  CREATININE 1.07 0.98  CALCIUM  --  9.0  GLUCOSE  --  317*   Protein Profile: No results for input(s): ALBUMIN in the last 168 hours.  Gastrointestinal Profile: Last BM:   08/13/2015   Nutrition-Focused Physical Exam Findings: Nutrition-Focused physical exam completed. Findings are WDL for fat depletion, muscle depletion, and edema.    Weight Change: Pt reports UBW of 180lbs but recently has been weighing 176lbs PTA. Per [facility] pt weight 176lbs 2 months ago (2% weight loss in 2 months)   Height:   Ht Readings from Last 1 Encounters:  08/13/15 5\' 9"  (1.753 m)    Weight:   Wt Readings from Last 1 Encounters:  08/13/15 173 lb (78.472 kg)   Wt Readings from Last 10 Encounters:  08/13/15 173 lb (78.472 kg)  07/22/15 171 lb 1.2 oz (77.599 kg)  07/12/15 173 lb (78.472 kg)  06/11/15 176 lb (79.833 kg)    BMI:  Body mass index is 25.54 kg/(m^2).  Estimated Nutritional Needs:   Kcal:  BEE: 1500kcals, TEE: (IF 1.1-1.3)(AF 1.2) 1980-2340kcals  Protein:  63-78g protein (0.8-1.0g/kg)  Fluid:  1963-2365mL of fluid (25-91mL/kg)  EDUCATION NEEDS:   Education needs no appropriate at this time   [place], RD, LDN Pager [phone]

## 2015-08-14 NOTE — Progress Notes (Signed)
Pt admitted from ED to Room 211 at shift change. Day shift charge nurse received report from nurse in ED.  Pt A&O x4. No signs of acute distress noted at this time. IV access intact & infusing. Medications administered as ordered (See MAR). Admission & Assessment completed.   Oriented pt to room. Education provided on call bell, telephone, IVpole/IV alarms. Education presented on white board as a pt/nurse Arboriculturist. White board completed and updated as needed.  Reviewed diet/medication orders with pt. Addressed all of the pt & family's questions.  VSS. Family at bedside and will continue to monitor.

## 2015-08-15 LAB — BASIC METABOLIC PANEL
ANION GAP: 7 (ref 5–15)
BUN: 10 mg/dL (ref 6–20)
CO2: 27 mmol/L (ref 22–32)
Calcium: 9 mg/dL (ref 8.9–10.3)
Chloride: 97 mmol/L — ABNORMAL LOW (ref 101–111)
Creatinine, Ser: 0.95 mg/dL (ref 0.61–1.24)
GFR calc Af Amer: >60 mL/min (ref >60)
GFR calc non Af Amer: >60 mL/min (ref >60)
GLUCOSE: 332 mg/dL — AB (ref 65–99)
POTASSIUM: 4.3 mmol/L (ref 3.5–5.1)
Sodium: 131 mmol/L — ABNORMAL LOW (ref 135–145)

## 2015-08-15 LAB — GLUCOSE, CAPILLARY
GLUCOSE-CAPILLARY: 264 mg/dL — AB (ref 65–99)
GLUCOSE-CAPILLARY: 191 mg/dL — AB (ref 65–99)
Glucose-Capillary: 321 mg/dL — ABNORMAL HIGH (ref 65–99)
Glucose-Capillary: 231 mg/dL — ABNORMAL HIGH (ref 65–99)

## 2015-08-15 LAB — CBC
HEMATOCRIT: 32.7 % — AB (ref 40.0–52.0)
HEMOGLOBIN: 10.1 g/dL — AB (ref 13.0–18.0)
MCH: 21.2 pg — ABNORMAL LOW (ref 26.0–34.0)
MCHC: 30.9 g/dL — ABNORMAL LOW (ref 32.0–36.0)
MCV: 68.7 fL — AB (ref 80.0–100.0)
Platelets: 429 10*3/uL (ref 150–440)
RBC: 4.75 MIL/uL (ref 4.40–5.90)
RDW: 30.2 % — ABNORMAL HIGH (ref 11.5–14.5)
WBC: 23.7 10*3/uL — AB (ref 3.8–10.6)

## 2015-08-15 MED ORDER — GLIPIZIDE 5 MG PO TABS
10.0000 mg | ORAL_TABLET | Freq: Two times a day (BID) | ORAL | Status: DC
  Administered 2015-08-15 – 2015-08-19 (×9): 10 mg via ORAL
  Filled 2015-08-15 (×9): qty 2

## 2015-08-15 NOTE — Progress Notes (Signed)
He reports good progress this morning.  He reports he is breathing easily without coughing.  He is doing incentive spirometry. He is taking his clear liquids well.  He is having no nausea.  He has passed some gas per rectum.   He is voiding satisfactorily  without discomfort. Yesterday he walked 1 time around the nursing station in the hallway.  He is not wearing his below-knee stockings this morning.  He reports no abdominal pain this morning.  He is awake alert and oriented.  Vital signs are stable.  Abdomen is soft and flat and nontender.  His dressing was changed.  His wounds are dry and intact with no drainage.  CLINICAL DATA:Last glucose was 321.  Other labs noted.  CBC with [place] 33[address], hemoglobin 10.1  Impression diabetes with hyperglycemia.  His white blood count was elevated at 15000 before surgery.  He does not appear to have any infection at at present.  Patient was advised to wear his support stockings.  This was discussed with the nurse.  Plan is to advance to full liquid diet carbohydrate control.  Add glipizide 10 mg b.i.d..  Continue sliding scale insulin .  Increase walking and [place].  Taper IV

## 2015-08-16 ENCOUNTER — Encounter: Payer: Self-pay | Admitting: Surgery

## 2015-08-16 LAB — GLUCOSE, CAPILLARY
GLUCOSE-CAPILLARY: 350 mg/dL — AB (ref 65–99)
GLUCOSE-CAPILLARY: 227 mg/dL — AB (ref 65–99)
Glucose-Capillary: 322 mg/dL — ABNORMAL HIGH (ref 65–99)
Glucose-Capillary: 207 mg/dL — ABNORMAL HIGH (ref 65–99)

## 2015-08-16 MED ORDER — INSULIN GLARGINE 100 UNIT/ML ~~LOC~~ SOLN
10.0000 [IU] | Freq: Every day | SUBCUTANEOUS | Status: DC
  Administered 2015-08-16 – 2015-08-18 (×3): 10 [IU] via SUBCUTANEOUS
  Filled 2015-08-16 (×4): qty 0.1

## 2015-08-16 NOTE — Care Management Important Message (Signed)
Important Message  Patient Details  Name: Aaron Allen MRN: [id number] Date of Birth: [birth year]   Medicare Important Message Given:  Yes-second notification given    Beau Fanny, RN 08/16/2015, 10:43 AM

## 2015-08-16 NOTE — Plan of Care (Signed)
Problem: Phase I Progression Outcomes Goal: Other Phase I Outcomes/Goals Outcome: Not Applicable Date Met:  88/50/27 No additional Phase Outcome/Goals identified at this time.     Problem: Phase II Progression Outcomes Goal: Other Phase II Outcomes/Goals Outcome: Not Applicable Date Met:  74/12/87 No additional Phase Outcome/Goals identified at this time.

## 2015-08-16 NOTE — Progress Notes (Signed)
Nutrition Follow-up       INTERVENTION:  Meals and snacks: Cater to pt preferences.  Once able to tolerate solid foods recommend soft diet (low fiber) Nutrition Supplement Therapy: Will add Medical Food Supplement Therapy: will recommend Mighty Shakes on meal trays TID for added nutrition (each shake provides 300kcals and 9g protein)    NUTRITION DIAGNOSIS:   Inadequate oral intake related to acute illness as evidenced by  (NPO diet order, just advanced to CL). Being addressed as diet progressed to full liquids    GOAL:   Patient will meet greater than or equal to 90% of their needs    MONITOR:    (Energy Intake, Glucose Profile, Anthropometrics, Digestive System)  REASON FOR ASSESSMENT:   Malnutrition Screening Tool    ASSESSMENT:   Pt admitted for lap right colectomy 08/13/2015 secondary to colon cancer found during colonoscopy. Per MD note, CT also consistent with hepatic metasis.    Current Nutrition: tolerating liquids, nothing eaten for lunch yet, ate yogurt at breakfast   Gastrointestinal Profile:+ flatus, belching Last BM: 9/11   Medications: D5 0.2% NS at 130ml/hr, lasix, remeron, protonix  Electrolyte/Renal Profile and Glucose Profile:   Recent Labs Lab 08/13/15 1040 08/14/15 0452 08/15/15 0422  NA  --  135 131*  K 3.0* 4.0 4.3  CL  --  100* 97*  CO2  --  26 27  BUN  --  10 10  CREATININE 1.07 0.98 0.95  CALCIUM  --  9.0 9.0  GLUCOSE  --  317* 332*       Weight Trend since Admission: Filed Weights   08/13/15 1023  Weight: 173 lb (78.472 kg)      Diet Order:  Diet full liquid Room service appropriate?: Yes; Fluid consistency:: Thin  Skin:   reviewed   Height:   Ht Readings from Last 1 Encounters:  08/13/15 5\' 9"  (1.753 m)    Weight:   Wt Readings from Last 1 Encounters:  08/13/15 173 lb (78.472 kg)     BMI:  Body mass index is 25.54 kg/(m^2).  Estimated Nutritional Needs:   Kcal:  BEE: 1500kcals, TEE: (IF  1.1-1.3)(AF 1.2) 1980-2340kcals  Protein:  63-78g protein (0.8-1.0g/kg)  Fluid:  1963-2379mL of fluid (25-20mL/kg)  EDUCATION NEEDS:   Education needs no appropriate at this time  [place]. Zenia Resides, [place], [place] (pager)

## 2015-08-16 NOTE — Progress Notes (Signed)
Inpatient Diabetes Program Recommendations  AACE/ADA: New Consensus Statement on Inpatient Glycemic Control (2013)  Target Ranges:  Prepandial:   less than 140 mg/dL      Peak postprandial:   less than 180 mg/dL (1-2 hours)      Critically ill patients:  140 - 180 mg/dL   Results for Aaron Allen, Aaron Allen (MRN [id number]) as of 08/16/2015 10:03  Ref. Range 08/15/2015 07:37 08/15/2015 11:27 08/15/2015 16:20 08/15/2015 20:58 08/16/2015 07:39  Glucose-Capillary Latest Ref Range: 65-99 mg/dL 321 (H) 264 (H) 231 (H) 191 (H) 350 (H)    Diabetes history: DM2 Outpatient Diabetes medications: Glipizide 10 mg BID Current orders for Inpatient glycemic control: Glipizide 10 mg BID, Novolog 0-9 units TID with meals, Novolog 0-5 units HS  Inpatient Diabetes Program Recommendations Insulin - Basal: Glucose ranged from 191-321 mg/dl on 9/11 and fasting glucose 350 mg/dl this morning. Please consider ordering low dose basal insulin; recommend starting with Levemir 8 units Q24H (based on 78 kg x 0.1 units).  Thanks, Barnie Alderman, RN, MSN, CCRN, CDE Diabetes Coordinator Inpatient Diabetes Program [phone] (Team Pager from [place] to [place]) [phone] ([place] office) [phone] [facility] office) [phone] [facility] office)

## 2015-08-16 NOTE — Progress Notes (Signed)
He reports no further nausea.  He has had some burping.  He is tolerating his liquids.  Passing urine satisfactorily.  He reports walking in the hallway 1 time today.  Vital signs stable.  Abdomen soft and nontender.  Encouraged to walk more frequently in the hallway,  Continue full liquids, carbohydrate control

## 2015-08-16 NOTE — Progress Notes (Signed)
He reports  he spit up a small amount this morning. He has had some mild nausea.  He was given Zofran.  He had 2 hydrocodone tablets during the night.  He reports no pain this morning.  He reports he is walking in the hallway.  He is breathing satisfactorily.  He reports no coughing.  He has been passing gas per rectum.  No bowel movement yet.  He is voiding satisfactorily.  Vital signs are stable.  He is awake alert and oriented and in no acute distress.  Lung sounds are clear.  Abdomen soft with minimal tenderness.  His dressing was removed.  His wounds appear to be healing satisfactorily.   Blood sugar this morning was 350  I discussed his hyperglycemia with his primary care physician Dr.Lithavong and will plan to start Lantus insulin 10 units at HS.  Plan is to continue with IV fluids, diet as tolerated, ambulation.  I advised him to just take Tylenol if needed for minor pain.

## 2015-08-17 ENCOUNTER — Inpatient Hospital Stay: Payer: Medicare PPO

## 2015-08-17 LAB — BASIC METABOLIC PANEL
Anion gap: 8 (ref 5–15)
Anion gap: 10 (ref 5–15)
BUN: 24 mg/dL — AB (ref 6–20)
BUN: 24 mg/dL — AB (ref 6–20)
CALCIUM: 9.1 mg/dL (ref 8.9–10.3)
CHLORIDE: 92 mmol/L — AB (ref 101–111)
CO2: 25 mmol/L (ref 22–32)
CO2: 27 mmol/L (ref 22–32)
CREATININE: 1.33 mg/dL — AB (ref 0.61–1.24)
CREATININE: 1.39 mg/dL — AB (ref 0.61–1.24)
Calcium: 9.1 mg/dL (ref 8.9–10.3)
Chloride: 95 mmol/L — ABNORMAL LOW (ref 101–111)
GFR calc non Af Amer: 48 mL/min — ABNORMAL LOW (ref >60)
GFR, EST AFRICAN AMERICAN: 58 mL/min — AB (ref >60)
GFR, EST AFRICAN AMERICAN: 55 mL/min — AB (ref >60)
GFR, EST NON AFRICAN AMERICAN: 50 mL/min — AB (ref >60)
GLUCOSE: 158 mg/dL — AB (ref 65–99)
Glucose, Bld: 210 mg/dL — ABNORMAL HIGH (ref 65–99)
Potassium: 4.6 mmol/L (ref 3.5–5.1)
Potassium: 4.9 mmol/L (ref 3.5–5.1)
SODIUM: 128 mmol/L — AB (ref 135–145)
Sodium: 129 mmol/L — ABNORMAL LOW (ref 135–145)

## 2015-08-17 LAB — CBC
HCT: 29.7 % — ABNORMAL LOW (ref 40.0–52.0)
Hemoglobin: 9.3 g/dL — ABNORMAL LOW (ref 13.0–18.0)
MCH: 21.2 pg — AB (ref 26.0–34.0)
MCHC: 31.3 g/dL — AB (ref 32.0–36.0)
MCV: 67.8 fL — ABNORMAL LOW (ref 80.0–100.0)
PLATELETS: 389 10*3/uL (ref 150–440)
RBC: 4.38 MIL/uL — ABNORMAL LOW (ref 4.40–5.90)
RDW: 29.8 % — ABNORMAL HIGH (ref 11.5–14.5)
WBC: 18.8 10*3/uL — ABNORMAL HIGH (ref 3.8–10.6)

## 2015-08-17 LAB — TROPONIN I

## 2015-08-17 LAB — GLUCOSE, CAPILLARY
GLUCOSE-CAPILLARY: 213 mg/dL — AB (ref 65–99)
Glucose-Capillary: 156 mg/dL — ABNORMAL HIGH (ref 65–99)
Glucose-Capillary: 182 mg/dL — ABNORMAL HIGH (ref 65–99)
Glucose-Capillary: 155 mg/dL — ABNORMAL HIGH (ref 65–99)

## 2015-08-17 LAB — SURGICAL PATHOLOGY

## 2015-08-17 LAB — MAGNESIUM: Magnesium: 1.8 mg/dL (ref 1.7–2.4)

## 2015-08-17 MED ORDER — DEXTROSE-NACL 5-0.45 % IV SOLN
INTRAVENOUS | Status: DC
  Administered 2015-08-17 – 2015-08-18 (×2): via INTRAVENOUS

## 2015-08-17 MED ORDER — IOHEXOL 350 MG/ML SOLN
100.0000 mL | Freq: Once | INTRAVENOUS | Status: AC | PRN
  Administered 2015-08-17: 100 mL via INTRAVENOUS

## 2015-08-17 NOTE — Progress Notes (Signed)
Pt alert and oriented. While doing medication pass, noticed patient sitting on side of bed, slumped over and breathing fast. Patient was slightly short of breath and had bounding pulses. Pt HR 125. BP 131/69. RR 24. O2 sat 96% on room air. Notified MD of patient condition, received order for stat EKG-12 lead and hospitalist consult. Patient had no complaints of pain. Now resting in bed. MD to come by to see patient. Continue to monitor.

## 2015-08-17 NOTE — Progress Notes (Signed)
The nurse called to report that his pulse rate was up to 125. She was concerned about possible palpitations. He reports no chest pain. He reports no dyspnea. He reports took about half of his upper. He reports walking in the hallway 3 times a day he reports passing his urine satisfactorily.  On examination he is awake alert and resting in bed. Respiratory rate is 32. Blood pressure 115/67 and pulse rate currently 110. Oxygen saturation is 93%. Heart regular rhythm S1 and S2. Lung sounds are clear. Abdomen is soft and nontender. Extremities with no dependent edema  EKG appears much like preop EKG with Q wave in lead 3  Impression tachycardia and tachypnea  Have discussed with Dr. Posey Pronto for internal medicine consultation, plan is to do metabolic panel b, magnesium, cardiac enzymes. Dr. Posey Pronto may consider CT of chest to evaluate for pulmonary embolism.

## 2015-08-17 NOTE — Progress Notes (Signed)
Inpatient Diabetes Program Recommendations  AACE/ADA: New Consensus Statement on Inpatient Glycemic Control (2013)  Target Ranges:  Prepandial:   less than 140 mg/dL      Peak postprandial:   less than 180 mg/dL (1-2 hours)      Critically ill patients:  140 - 180 mg/dL   Results for Aaron Allen, Aaron Allen (MRN [id number]) as of 08/17/2015 08:00  Ref. Range 08/16/2015 07:39 08/16/2015 11:23 08/16/2015 16:59 08/16/2015 21:22 08/17/2015 07:54  Glucose-Capillary Latest Ref Range: 65-99 mg/dL 350 (H) 322 (H) 227 (H) 207 (H) 213 (H)   Diabetes history: DM2 Outpatient Diabetes medications: Glipizide 10 mg BID Current orders for Inpatient glycemic control: Glipizide 10 mg BID, Novolog 0-9 units TID with meals, Novolog 0-5 units HS, Lantus 10 units QHS  Inpatient Diabetes Program Recommendations Insulin - Basal: Please consider increasing Lantus to 12 units QHS.  Thanks, Barnie Alderman, RN, MSN, CCRN, CDE Diabetes Coordinator Inpatient Diabetes Program [phone] (Team Pager from [place] to [place]) [phone] ([place] office) [phone] [facility] office) [phone] [facility] office)'

## 2015-08-17 NOTE — Progress Notes (Signed)
He reports feeling better today.  He is no longer nauseated.  He reports he took about half of his breakfast this morning.  He is passing some gas per rectum.   He is voiding satisfactorily. He reports no abdominal pain.  He reports he is breathing satisfactorily.  He reports he walked in the hallway 4 times yesterday.  Vital signs are stable.  He is awake alert and oriented.  Abdomen is soft and nontender.  His incisions appear to be healing satisfactorily.  Blood sugar is better controlled 213 this morning.  CBC with a white blood count improved at 18.8, hemoglobin 9.3, creatinine increased at 1.33 with BUN of 24, sodium 128.  Impression:    mild dehydration now with improved oral intake.  I recommended increased walking.  Continue with full liquid diet at present.  I discuss with patient and family anticipation of need for injection of Lantus insulin at bedtime  following discharge.

## 2015-08-17 NOTE — Consult Note (Signed)
[facility] HOSPITALIST  Medical Consultation  LEVOY GEISEN ZYS:[id number] DOB: [birth year] DOA: 08/13/2015 PCP: Dion Body, MD   Requesting physician:  Rochel Brome MD Date of consultation:  08/13/15 Reason for consultation:  Palpitations tachycardia  CHIEF COMPLAINT:  No chief complaint on file.   HISTORY OF PRESENT ILLNESS: Aaron Allen  is a 74 y.o. male with a known history of  carcinoma the right colon who underwent a laparoscopic right colectomy on 9/09. Patient's intake has been poor.  He was feeling okay until earlier today when he started complaining of palpitations. His heart rate was also noticed to be increased. I was called to see the patient a stat EKG was done which shows sinus tachycardia with PVCs. Patient also has had some shortness of breath. Denies any chest pain PAST MEDICAL HISTORY:   Past Medical History  Diagnosis Date  . Diabetes mellitus without complication   . Hypertension   . HLD (hyperlipidemia)   . Arthritis   . Moderate mitral regurgitation   . Complication of anesthesia   . Chronic kidney disease   . Cancer     Colon CA  . Anemia     FOR IRON INFUSION 08/11/15 REPEAT LABS 08/12/15    PAST SURGICAL HISTORY:  Past Surgical History  Procedure Laterality Date  . Colonoscopy with propofol N/A 07/19/2015    Procedure: COLONOSCOPY WITH PROPOFOL;  Surgeon: Josefine Class, MD;  Location: [facility] ENDOSCOPY;  Service: Endoscopy;  Laterality: N/A;  . Esophagogastroduodenoscopy (egd) with propofol N/A 07/19/2015    Procedure: ESOPHAGOGASTRODUODENOSCOPY (EGD) WITH PROPOFOL;  Surgeon: Josefine Class, MD;  Location: [facility] ENDOSCOPY;  Service: Endoscopy;  Laterality: N/A;  . Laparoscopic right colectomy N/A 08/13/2015    Procedure: LAPAROSCOPIC RIGHT COLECTOMY;  Surgeon: Leonie Green, MD;  Location: [facility] ORS;  Service: General;  Laterality: N/A;    SOCIAL HISTORY:  Social History  Substance Use Topics  . Smoking status: Former Smoker    Quit date:  08/04/1966  . Smokeless tobacco: Current User    Types: Chew  . Alcohol Use: No    FAMILY HISTORY:  Family History  Problem Relation Age of Onset  . Stroke Father   . Lung cancer Father     DRUG ALLERGIES: No Known Allergies  REVIEW OF SYSTEMS:   CONSTITUTIONAL: No fever, fatigue or weakness.  EYES: No blurred or double vision.  EARS, NOSE, AND THROAT: No tinnitus or ear pain.  RESPIRATORY: No cough, shortness of breath, wheezing or hemoptysis.  CARDIOVASCULAR: No chest pain, orthopnea, edema. Positive for palpitations GASTROINTESTINAL: No nausea, vomiting, diarrhea or abdominal pain.  GENITOURINARY: No dysuria, hematuria.  ENDOCRINE: No polyuria, nocturia,  HEMATOLOGY: No anemia, easy bruising or bleeding SKIN: No rash or lesion. MUSCULOSKELETAL: No joint pain or arthritis.   NEUROLOGIC: No tingling, numbness, weakness.  PSYCHIATRY: No anxiety or depression.   MEDICATIONS AT HOME:  Prior to Admission medications   Medication Sig Start Date End Date Taking? Authorizing Provider  pantoprazole (PROTONIX) 40 MG tablet Take 1 tablet (40 mg total) by mouth 2 (two) times daily. Switch for any other PPI at similar dose and frequency Patient taking differently: Take 40 mg by mouth 2 (two) times daily.  06/12/15  Yes Theodoro Grist, MD  pravastatin (PRAVACHOL) 40 MG tablet Take 40 mg by mouth at bedtime.    Yes Historical Provider, MD  glipiZIDE (GLUCOTROL) 10 MG tablet Take 10 mg by mouth 2 (two) times daily.     Historical Provider, MD  NIFEdipine (PROCARDIA  XL/ADALAT-CC) 90 MG 24 hr tablet Take 90 mg by mouth daily.    Historical Provider, MD  quinapril (ACCUPRIL) 20 MG tablet Take 40 mg by mouth daily.     Historical Provider, MD      PHYSICAL EXAMINATION:   VITAL SIGNS: Blood pressure 115/67, pulse 110, temperature 98.7 F (37.1 C), temperature source Oral, resp. rate 32, height 5\' 9"  (1.753 m), weight 78.472 kg (173 lb), SpO2 93 %.  GENERAL:  74 y.o.-year-old patient lying  in the bed with no acute distress.  EYES: Pupils equal, round, reactive to light and accommodation. No scleral icterus. Extraocular muscles intact.  HEENT: Head atraumatic, normocephalic. Oropharynx and nasopharynx clear.  NECK:  Supple, no jugular venous distention. No thyroid enlargement, no tenderness.  LUNGS: Normal breath sounds bilaterally, no wheezing, rales,rhonchi or crepitation. No use of accessory muscles of respiration.  CARDIOVASCULAR: S1, S2 tachycardic. No murmurs, rubs, or gallops.  ABDOMEN: Soft, nontender, nondistended. Bowel sounds present. No organomegaly or mass.  EXTREMITIES: No pedal edema, cyanosis, or clubbing.  NEUROLOGIC: Cranial nerves II through XII are intact. Muscle strength 5/5 in all extremities. Sensation intact. Gait not checked.  PSYCHIATRIC: [name].  SKIN: No obvious rash, lesion, or ulcer.   LABORATORY PANEL:   CBC  Recent Labs Lab 08/11/15 0851 08/12/15 1044 08/13/15 1040 08/14/15 0452 08/15/15 0422 08/17/15 0441  WBC 15.1* 15.1*  --  23.6* 23.7* 18.8*  HGB 7.8* 9.3* 9.8* 9.9* 10.1* 9.3*  HCT 24.7* 29.8*  --  32.0* 32.7* 29.7*  PLT 480* 490*  --  467* 429 389  MCV 63.9* 67.8*  --  68.4* 68.7* 67.8*  MCH 20.1* 21.2*  --  21.2* 21.2* 21.2*  MCHC 31.5* 31.3*  --  31.0* 30.9* 31.3*  RDW 27.9* 29.3*  --  29.7* 30.2* 29.8*  LYMPHSABS 0.7* 0.8*  --   --   --   --   MONOABS 1.2* 1.3*  --   --   --   --   EOSABS 0.0 0.1  --   --   --   --   BASOSABS 0.0 0.1  --   --   --   --    ------------------------------------------------------------------------------------------------------------------  Chemistries   Recent Labs Lab 08/13/15 1040 08/14/15 0452 08/15/15 0422 08/17/15 0441  NA  --  135 131* 128*  K 3.0* 4.0 4.3 4.6  CL  --  100* 97* 95*  CO2  --  26 27 25   GLUCOSE  --  317* 332* 210*  BUN  --  10 10 24*  CREATININE 1.07 0.98 0.95 1.33*  CALCIUM  --  9.0 9.0 9.1    ------------------------------------------------------------------------------------------------------------------ estimated creatinine clearance is 47.3 mL/min (by C-G formula based on Cr of 1.33). ------------------------------------------------------------------------------------------------------------------ No results for input(s): TSH, T4TOTAL, T3FREE, THYROIDAB in the last 72 hours.  Invalid input(s): FREET3   Coagulation profile No results for input(s): INR, PROTIME in the last 168 hours. ------------------------------------------------------------------------------------------------------------------- No results for input(s): DDIMER in the last 72 hours. -------------------------------------------------------------------------------------------------------------------  Cardiac Enzymes No results for input(s): CKMB, TROPONINI, MYOGLOBIN in the last 168 hours.  Invalid input(s): CK ------------------------------------------------------------------------------------------------------------------ Invalid input(s): POCBNP  ---------------------------------------------------------------------------------------------------------------  Urinalysis No results found for: COLORURINE, APPEARANCEUR, LABSPEC, PHURINE, GLUCOSEU, HGBUR, BILIRUBINUR, KETONESUR, PROTEINUR, UROBILINOGEN, NITRITE, LEUKOCYTESUR   RADIOLOGY: No results found.  EKG: Orders placed or performed during the hospital encounter of 08/13/15  . EKG 12-Lead  . EKG 12-Lead  . EKG 12-Lead  . EKG 12-Lead  IMPRESSION AND PLAN: Patient is a 74 year old for American American male status post surgery with palpitation and tachycardia  1. Palpitation and tachycardia with some shortness of breath: I will check a CT per PE protocol to rule out PE, check electrolytes monitor him on telemetry  2. Diabetes type 2 continue glipizide and insulin  3, hypertension blood pressure currently controlled continue  lisinopril  4. Hyperlipidemia continue pravastatin  5. Miscellaneous continue Lovenox for DVT prophylaxis    All the records are reviewed and case discussed with ED provider. Management plans discussed with the patient, family and they are in agreement.  CODE STATUS:    Code Status Orders        Start     Ordered   08/13/15 1922  Full code   Continuous     08/13/15 1922       TOTAL TIME TAKING CARE OF THIS PATIENT: 55 minutes.    Dustin Flock M.D on 08/17/2015 at 8:26 PM  Between 7am to 6pm - Pager - [phone]  After 6pm go to [email or web address] - password [email or web address] [place] [facility]  Office  [phone]  CC: Primary care physician; Dion Body, MD

## 2015-08-18 ENCOUNTER — Encounter: Payer: Self-pay | Admitting: Nurse Practitioner

## 2015-08-18 ENCOUNTER — Inpatient Hospital Stay (HOSPITAL_COMMUNITY)
Admission: RE | Admit: 2015-08-18 | Discharge: 2015-08-18 | Disposition: A | Discharge status: Home / Self Care | Payer: Medicare PPO | Source: Ambulatory Visit | Attending: Nurse Practitioner | Admitting: Nurse Practitioner

## 2015-08-18 DIAGNOSIS — E119 Type 2 diabetes mellitus without complications: Secondary | ICD-10-CM

## 2015-08-18 DIAGNOSIS — R7989 Other specified abnormal findings of blood chemistry: Secondary | ICD-10-CM

## 2015-08-18 DIAGNOSIS — N183 Chronic kidney disease, stage 3 unspecified: Secondary | ICD-10-CM | POA: Diagnosis present

## 2015-08-18 DIAGNOSIS — R0600 Dyspnea, unspecified: Secondary | ICD-10-CM

## 2015-08-18 DIAGNOSIS — C182 Malignant neoplasm of ascending colon: Principal | ICD-10-CM

## 2015-08-18 DIAGNOSIS — R778 Other specified abnormalities of plasma proteins: Secondary | ICD-10-CM

## 2015-08-18 DIAGNOSIS — I10 Essential (primary) hypertension: Secondary | ICD-10-CM

## 2015-08-18 DIAGNOSIS — I340 Nonrheumatic mitral (valve) insufficiency: Secondary | ICD-10-CM | POA: Insufficient documentation

## 2015-08-18 DIAGNOSIS — C189 Malignant neoplasm of colon, unspecified: Secondary | ICD-10-CM | POA: Diagnosis present

## 2015-08-18 DIAGNOSIS — E785 Hyperlipidemia, unspecified: Secondary | ICD-10-CM | POA: Diagnosis present

## 2015-08-18 DIAGNOSIS — D509 Iron deficiency anemia, unspecified: Secondary | ICD-10-CM

## 2015-08-18 DIAGNOSIS — C787 Secondary malignant neoplasm of liver and intrahepatic bile duct: Secondary | ICD-10-CM

## 2015-08-18 LAB — URINALYSIS COMPLETE WITH MICROSCOPIC (ARMC ONLY)
Bacteria, UA: NONE SEEN
Bilirubin Urine: NEGATIVE
Glucose, UA: NEGATIVE mg/dL
Hgb urine dipstick: NEGATIVE
Ketones, ur: NEGATIVE mg/dL
Leukocytes, UA: NEGATIVE
Nitrite: NEGATIVE
PH: 5 (ref 5.0–8.0)
PROTEIN: NEGATIVE mg/dL
SPECIFIC GRAVITY, URINE: 1.039 — AB (ref 1.005–1.030)

## 2015-08-18 LAB — GLUCOSE, CAPILLARY
GLUCOSE-CAPILLARY: 126 mg/dL — AB (ref 65–99)
Glucose-Capillary: 195 mg/dL — ABNORMAL HIGH (ref 65–99)
Glucose-Capillary: 216 mg/dL — ABNORMAL HIGH (ref 65–99)
Glucose-Capillary: 190 mg/dL — ABNORMAL HIGH (ref 65–99)

## 2015-08-18 LAB — BASIC METABOLIC PANEL
Anion gap: 11 (ref 5–15)
BUN: 24 mg/dL — AB (ref 6–20)
CHLORIDE: 94 mmol/L — AB (ref 101–111)
CO2: 24 mmol/L (ref 22–32)
CREATININE: 1.28 mg/dL — AB (ref 0.61–1.24)
Calcium: 9.1 mg/dL (ref 8.9–10.3)
GFR calc Af Amer: >60 mL/min (ref >60)
GFR calc non Af Amer: 53 mL/min — ABNORMAL LOW (ref >60)
GLUCOSE: 187 mg/dL — AB (ref 65–99)
Potassium: 4.5 mmol/L (ref 3.5–5.1)
Sodium: 129 mmol/L — ABNORMAL LOW (ref 135–145)

## 2015-08-18 LAB — TSH: TSH: 3.374 u[IU]/mL (ref 0.350–4.500)

## 2015-08-18 LAB — TROPONIN I: Troponin I: 0.1 ng/mL — ABNORMAL HIGH (ref <0.031)

## 2015-08-18 MED ORDER — SODIUM CHLORIDE 0.9 % IV SOLN
INTRAVENOUS | Status: DC
  Administered 2015-08-18 – 2015-08-19 (×2): via INTRAVENOUS

## 2015-08-18 MED ORDER — ASPIRIN 325 MG PO TABS
325.0000 mg | ORAL_TABLET | Freq: Every day | ORAL | Status: DC
  Administered 2015-08-18: 325 mg via ORAL
  Filled 2015-08-18: qty 1

## 2015-08-18 MED ORDER — NICOTINE 14 MG/24HR TD PT24
14.0000 mg | MEDICATED_PATCH | Freq: Every day | TRANSDERMAL | Status: DC
  Administered 2015-08-18: 14 mg via TRANSDERMAL
  Filled 2015-08-18: qty 1

## 2015-08-18 NOTE — Progress Notes (Signed)
He has been started on NicoDerm patch. He is a former smoker but quit smoking in 1967. Therefore NicoDerm patch not indicated.  He did have some nausea earlier this afternoon and was given Zofran which helped. He reports no vomiting. As noted earlier he had a large bowel movement this morning and reports he has had 2 additional bowel movement since then.   He walked in the hallway 3 times yesterday but has not yet walked in the hallway today.  He reports good urine output  Continue with full liquids and with IV hydration at present  Discontinue NicoDerm patch  Nurse is to obtain a new pair of TEDs stockings  He was encouraged to walk in the hallway.

## 2015-08-18 NOTE — Progress Notes (Signed)
He reports no pain or discomfort this morning.  No nausea or vomiting.  He has had a large bowel movement this morning.  He reports no abdominal pain.  He did have a temperature of a 101 last night and temperature is down this morning.  He reports voiding satisfactorily.  No dysuria.  He reports no current dyspnea, no coughing and no chest pain.  He reports limited amount of walking.    I reviewed Internal Medicine notes and cardiology notes.    On examination he is awake alert and oriented and in no acute distress.  Lung sounds are clear.  Heart regular rhythm S1-S2.  Abdomen is soft and nontender.  His incisions are healing satisfactorily.    Troponin 0.1, latest glucose 216, creatinine 1.28   CT demonstrated no pulmonary embolism.  CT did demonstrate emphysema.  I noted there was a large amount of fluid within the stomach on the CT.    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

## 2015-08-18 NOTE — Progress Notes (Signed)
*  PRELIMINARY RESULTS* Echocardiogram 2D Echocardiogram has been performed.  Aaron Allen 08/18/2015, 2:08 PM

## 2015-08-18 NOTE — Progress Notes (Signed)
[place] [facility] [place] NAME: Ocean Schildt    MR#:  [id number]  DATE OF BIRTH:  1941/04/23  SUBJECTIVE:  CHIEF COMPLAINT:  No chief complaint on file.  No complaint. He denies any chest pain, palpitation or shortness of breath.  REVIEW OF SYSTEMS:  CONSTITUTIONAL: No fever, fatigue or weakness.  EYES: No blurred or double vision.  EARS, NOSE, AND THROAT: No tinnitus or ear pain.  RESPIRATORY: No cough, shortness of breath, wheezing or hemoptysis.  CARDIOVASCULAR: No chest pain, orthopnea, edema.  GASTROINTESTINAL: No nausea, vomiting, diarrhea or abdominal pain.  GENITOURINARY: No dysuria, hematuria.  ENDOCRINE: No polyuria, nocturia,  HEMATOLOGY: No anemia, easy bruising or bleeding SKIN: No rash or lesion. MUSCULOSKELETAL: No joint pain or arthritis.   NEUROLOGIC: No tingling, numbness, weakness.  PSYCHIATRY: No anxiety or depression.   DRUG ALLERGIES:  No Known Allergies  VITALS:  Blood pressure 110/62, pulse 95, temperature 98.7 F (37.1 C), temperature source Oral, resp. rate 18, height 5\' 9"  (1.753 m), weight 78.472 kg (173 lb), SpO2 96 %.  PHYSICAL EXAMINATION:  GENERAL:  74 y.o.-year-old patient lying in the bed with no acute distress.  EYES: Pupils equal, round, reactive to light and accommodation. No scleral icterus. Extraocular muscles intact.  HEENT: Head atraumatic, normocephalic. Oropharynx and nasopharynx clear.  NECK:  Supple, no jugular venous distention. No thyroid enlargement, no tenderness.  LUNGS: Normal breath sounds bilaterally, no wheezing, mild crackles on right side. No use of accessory muscles of respiration.  CARDIOVASCULAR: S1, S2 normal. No murmurs, rubs, or gallops.  ABDOMEN: Soft, nontender, nondistended. Bowel sounds present. No organomegaly or mass.  EXTREMITIES: No pedal edema, cyanosis, or clubbing.  NEUROLOGIC: Cranial nerves II through XII are intact. Muscle strength 5/5 in all extremities.  Sensation intact. Gait not checked.  PSYCHIATRIC: [name].  SKIN: No obvious rash, lesion, or ulcer.    LABORATORY PANEL:   CBC  Recent Labs Lab 08/17/15 0441  WBC 18.8*  HGB 9.3*  HCT 29.7*  PLT 389   ------------------------------------------------------------------------------------------------------------------  Chemistries   Recent Labs Lab 08/17/15 2015 08/18/15 0542  NA 129* 129*  K 4.9 4.5  CL 92* 94*  CO2 27 24  GLUCOSE 158* 187*  BUN 24* 24*  CREATININE 1.39* 1.28*  CALCIUM 9.1 9.1  MG 1.8  --    ------------------------------------------------------------------------------------------------------------------  Cardiac Enzymes  Recent Labs Lab 08/18/15 1253  TROPONINI <0.03   ------------------------------------------------------------------------------------------------------------------  RADIOLOGY:  Ct Angio Chest Pe W/cm &/or Wo Cm  08/17/2015   CLINICAL DATA:  Shortness breath, personal history of colon cancer  EXAM: CT ANGIOGRAPHY CHEST WITH CONTRAST  TECHNIQUE: Multidetector CT imaging of the chest was performed using the standard protocol during bolus administration of intravenous contrast. Multiplanar CT image reconstructions and MIPs were obtained to evaluate the vascular anatomy.  CONTRAST:  164mL OMNIPAQUE IOHEXOL 350 MG/ML SOLN  COMPARISON:  None.  FINDINGS: Bilateral lower lobe atelectasis. 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 No pleural or pericardial effusion. Heart size upper normal. Distal esophagus shows wall thickening.  Thoracic aorta demonstrates no dissection or dilatation. No filling defects in the pulmonary arterial system.  Images through the upper abdomen demonstrate partial visualization of innumerable peripherally enhancing low-attenuation liver lesions.  There are no acute musculoskeletal findings.  Review of the MIP images confirms the above findings.  IMPRESSION: 1. No  evidence of pulmonary embolism 2. 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   On: 08/17/2015 21:40    EKG:   Orders placed or performed during the hospital encounter of 08/13/15  . EKG 12-Lead  . EKG 12-Lead  . EKG 12-Lead  . EKG 12-Lead    ASSESSMENT AND PLAN:   1. Palpitations and tachycardia with some shortness of breath. Improved.  CT angiogram of the chest that didn't show any PE but showed emphysema.  2. Elevated troponin. Possible due to demanding ischemia due to 1. Continue aspirin and statin, Syd Newsome down from 0.1 to less than 0.03 today. Follow-up cardiograph. Per cardiologist consult, no further cardiac workup at this time.  3.Diabetes type 2. Stable. continue sliding scale and Lantus .   4. Tobacco abuse. Smoking cessation was counseled for 3-4 minutes and will give the patch.   5. Hypertension. Controlled, continue hypertension medication.   * Hyponatremia. Discontinue D5 half-normal saline and change to  normal saline IV, follow-up BMP.   *. Acute renal failure. Improving, continue IV fluid support and follow-up BMP.  *. Leukocytosis. Follow-up CBC.  * History of carcinoma the right colon who underwent a laparoscopic right colectomy. Follow-up surgeon.  All the records are reviewed and case discussed with Care Management/Social Workerr. Management plans discussed with the patient, his daughter and they are in agreement. Greater than 50% of time spent in coordination of care.  CODE STATUS: full code  TOTAL TIME TAKING CARE OF THIS PATIENT: 36 minutes.   POSSIBLE D/C IN 3 DAYS, DEPENDING ON CLINICAL CONDITION.   Demetrios Loll M.D on 08/18/2015 at 2:00 PM  Between 7am to 6pm - Pager - [phone]  After 6pm go to [email or web address] - password [email or web address] [place] [facility]  Office  [phone]  CC: Primary care physician; Dion Body, MD

## 2015-08-18 NOTE — Progress Notes (Signed)
Spoke with Dr. Bridgett Larsson regarding troponin level of 0.10. MD acknowledge. No new orders at this time.

## 2015-08-18 NOTE — Care Management Note (Signed)
Case Management Note  Patient Details  Name: [name] MRN: [id number] Date of Birth: [birth year]  Subjective/Objective:        This man is here after having a colectomy on 08/13/15.He is increasing his diet, as he has liquids, but today is very uncomfortable. He had some tachycardia last night , which they noted had PVC's and so he was seen today by cardiology. When asked he states he normally is independent for his ADL's and drives himself to his appointments.  Now he wonders if he might need f/u once discharged as he moves a bit slower and does has this fullness feeling that is hindering him consuming his liquid diet. Vitals appear fine when checked, but pt. Is complaining of SOB.  Stag[place] assisted pt. Back to bed.             Action/Plan:   Expected Discharge Date:                  Expected Discharge Plan:     In-House Referral:     Discharge planning Services     Post Acute Care Choice:    Choice offered to:     DME Arranged:    DME Agency:     HH Arranged:    [place] Agency:     Status of Service:     Medicare Important Message Given:  Yes-third notification given Date Medicare IM Given:    Medicare IM give by:    Date Additional Medicare IM Given:    Additional Medicare Important Message give by:     If discussed at [place] of Stay Meetings, dates discussed:    Additional Comments:  Beau Fanny, RN 08/18/2015, 1:03 PM

## 2015-08-18 NOTE — Care Management Important Message (Signed)
Important Message  Patient Details  Name: Aaron Allen MRN: [id number] Date of Birth: 1941-11-03   Medicare Important Message Given:  Yes-third notification given    [name] 08/18/2015, 10:31 AM

## 2015-08-18 NOTE — Consult Note (Signed)
CARDIOLOGY CONSULT NOTE   Patient ID: Aaron Allen MRN: [id number], DOB/AGE: November 01, 1941   Admit date: 08/13/2015 Date of Consult: 08/18/2015   Primary Physician: Dion Body, MD Primary Cardiologist: new - seen by D. Ellyn Hack, MD   Pt. Profile  74 y/o male w/o prior cardiac hx, whom we've been asked to eval in the setting of elevated troponin and sinus tachycardia following recent laparoscopic right colectomy 2/2 metastatic colon CA with hepatic mets and IDA.  Problem List  Past Medical History  Diagnosis Date  . Diabetes mellitus without complication   . Hypertension   . HLD (hyperlipidemia)   . Arthritis   . Moderate mitral regurgitation   . Complication of anesthesia   . CKD II - III   . Metastatic colon cancer to liver     a. 08/13/2015 s/p lap R colectomy.  . Iron deficiency anemia     a. In setting of metastatic colon CA.    Past Surgical History  Procedure Laterality Date  . Colonoscopy with propofol N/A 07/19/2015    Procedure: COLONOSCOPY WITH PROPOFOL;  Surgeon: Josefine Class, MD;  Location: [facility] ENDOSCOPY;  Service: Endoscopy;  Laterality: N/A;  . Esophagogastroduodenoscopy (egd) with propofol N/A 07/19/2015    Procedure: ESOPHAGOGASTRODUODENOSCOPY (EGD) WITH PROPOFOL;  Surgeon: Josefine Class, MD;  Location: [facility] ENDOSCOPY;  Service: Endoscopy;  Laterality: N/A;  . Laparoscopic right colectomy N/A 08/13/2015    Procedure: LAPAROSCOPIC RIGHT COLECTOMY;  Surgeon: Leonie Green, MD;  Location: [facility] ORS;  Service: General;  Laterality: N/A;     Allergies  No Known Allergies  HPI   74 y/o male with the above complex PMH.  He has previously been evaluated by cardiology @   He recently underwent evaluation related to iron deficiency anemia and was found to have cancer of the right colon on colonoscopy.  CT of the abdomen showed multifocal colon cancer involving the cecum and right colon with resulting obstruction of the appendiceal lumen.   Widespread hepatic metastatic disease with nodal metastases in the ileocolonic mesentery was also noted.  He was seen by general surgery and subsequently underwent laparoscopic right colectomy on 9/9 in an effort to help resolve bleeding and prevent obstruction.  Post-operative course has been minimally complicated by dehydration and hyperglycemia.    On 9/13, he got up for a walk around the unit and c/o dizziness and mild dyspnea.  He was only able to make 2 laps around the nursing station before stopping b/c he felt tired.  He was noted to be tachycardic and an ECG confirmed sinus tachycardia w/o acute ST/T changes.  He did not have c/p.  He was seen by internal medicine and troponins were sent off and returned mildly abnl @ < 0.03  0.10.  CTA of the Chest was neg for PE.  Moderately severe, diffuse emphysema was noted. He has been receiving IVFs and HR's at rest overnight have been stable in the 90's to low 100's.  He denies dizziness or dyspnea this AM.  We have been asked to eval 2/2 elevated troponins.  Inpatient Medications  . aspirin  325 mg Oral Daily  . enoxaparin (LOVENOX) injection  40 mg Subcutaneous Q24H  . glipiZIDE  10 mg Oral BID AC  . insulin aspart  0-5 Units Subcutaneous QHS  . insulin aspart  0-9 Units Subcutaneous TID WC  . insulin glargine  10 Units Subcutaneous QHS  . lisinopril  40 mg Oral Daily  . NIFEdipine  90 mg  Oral Daily  . pantoprazole  40 mg Oral BID  . pravastatin  40 mg Oral QHS  . senna  1 tablet Oral BID    Family History Family History  Problem Relation Age of Onset  . Stroke Father   . Lung cancer Father      Social History Social History   Social History  . Marital Status: Widowed    Spouse Name: N/A  . Number of Children: N/A  . Years of Education: N/A   Occupational History  . Not on file.   Social History Main Topics  . Smoking status: Former Smoker    Quit date: 08/04/1966  . Smokeless tobacco: Current User    Types: Chew  .  Alcohol Use: No  . Drug Use: No  . Sexual Activity: Not on file   Other Topics Concern  . Not on file   Social History Narrative   Lives in [place].  Works @ [employer].     Review of Systems  General:  No chills, fever, night sweats or weight changes.  Cardiovascular:  No chest pain, +++ dyspnea on exertion and dizziness while standing yesterday.  No edema, orthopnea, palpitations, paroxysmal nocturnal dyspnea. Dermatological: No rash, lesions/masses Respiratory: No cough, +++ dyspnea Urologic: No hematuria, dysuria Abdominal:   Had been constipated with abd bloating but had large BM this morning with improved appetite afterwards.  No nausea, vomiting, diarrhea, bright red blood per rectum, melena, or hematemesis Neurologic:  No visual changes, wkns, changes in mental status. All other systems reviewed and are otherwise negative except as noted above.  Physical Exam  Blood pressure 107/66, pulse 107, temperature 98.5 F (36.9 C), temperature source Oral, resp. rate 20, height 5\' 9"  (1.753 m), weight 173 lb (78.472 kg), SpO2 96 %.  General: Pleasant, NAD Psych: Normal affect. Neuro: Alert and oriented X 3. Moves all extremities spontaneously. HEENT: Normal  Neck: Supple without bruits or JVD. Lungs:  Resp regular and unlabored, bibasilar crackles. Heart: RRR no s3, s4, 2/6 syst murmur @ llsb. Abdomen: Soft, diffusely tender, non-distended, BS + x 4.  Extremities: No clubbing, cyanosis or edema. DP/PT/Radials 2+ and equal bilaterally.  Labs   Recent Labs  08/17/15 2017 08/18/15 0542  TROPONINI <0.03 0.10*   Lab Results  Component Value Date   WBC 18.8* 08/17/2015   HGB 9.3* 08/17/2015   HCT 29.7* 08/17/2015   MCV 67.8* 08/17/2015   PLT 389 08/17/2015     Recent Labs Lab 08/18/15 0542  NA 129*  K 4.5  CL 94*  CO2 24  BUN 24*  CREATININE 1.28*  CALCIUM 9.1  GLUCOSE 187*   Radiology/Studies  Ct Angio Chest Pe W/cm &/or Wo Cm  08/17/2015   CLINICAL  DATA:  Shortness breath, personal history of colon cancer  EXAM: CT ANGIOGRAPHY CHEST WITH CONTRAST  TECHNIQUE: Multidetector CT imaging of the chest was performed using the standard protocol during bolus administration of intravenous contrast. Multiplanar CT image reconstructions and MIPs were obtained to evaluate the vascular anatomy.  CONTRAST:  178mL OMNIPAQUE IOHEXOL 350 MG/ML SOLN  COMPARISON:  None.  FINDINGS: Bilateral lower lobe atelectasis. 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 No pleural or pericardial effusion. Heart size upper normal. Distal esophagus shows wall thickening.  Thoracic aorta demonstrates no dissection or dilatation. No filling defects in the pulmonary arterial system.  Images through the upper abdomen demonstrate partial visualization of innumerable peripherally enhancing low-attenuation liver lesions.  There are no acute musculoskeletal  findings.  Review of the MIP images confirms the above findings.  IMPRESSION: 1. No evidence of pulmonary embolism 2. 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   On: 08/17/2015 21:40   Ct Abdomen Pelvis W Contrast  07/27/2015   CLINICAL DATA:  Initial staging for recently diagnosed colon cancer. History of diabetes, hypertension and chronic kidney disease. Initial encounter.  EXAM: CT ABDOMEN AND PELVIS WITH CONTRAST  TECHNIQUE: Multidetector CT imaging of the abdomen and pelvis was performed using the standard protocol following bolus administration of intravenous contrast.  CONTRAST:  139mL OMNIPAQUE IOHEXOL 300 MG/ML  SOLN  COMPARISON:  None.  FINDINGS: Lower chest: Panacinar emphysema noted at both lung bases. No significant pleural or pericardial effusion.  Hepatobiliary: There are innumerable heterogeneously enhancing lesions throughout the liver consistent with widespread metastatic disease. A representative lesion  centrally in the right hepatic lobe measures 2.4 x 2.4 cm on image 18. The gallbladder is incompletely distended, likely accounting for mild prominence of its wall. No evidence of gallstones or biliary dilatation.  Pancreas: No suspicious findings. Probable 8 mm cyst within the uncinate process on image number 30. There is no pancreatic ductal dilatation.  Spleen: Normal in size without focal abnormality.  Adrenals/Urinary Tract: Both adrenal glands appear normal. The kidneys appear normal without evidence of urinary tract calculus, suspicious lesion or hydronephrosis. No bladder abnormalities are seen.  Stomach/Bowel: There is a large infiltrating and partially constricting mass near the hepatic flexure of the colon. There is a possible second infiltrating mass more proximally in the cecum near the ileocecal valve. The terminal ileum and ileocecal valve appear normal, however, there is dilatation of the appendix which is fluid-filled. Contrast passes into the distal colon. There is no evidence of bowel obstruction.  Vascular/Lymphatic: There are several enlarged ileocolonic mesenteric lymph nodes consistent with nodal metastases. The largest of these measures 2.6 x 1.4 cm on image 38. No retroperitoneal or pelvic lymphadenopathy. Prominent inguinal lymph nodes have preserved fatty hila, likely reactive. Mild aortoiliac atherosclerosis.  Reproductive: Mild prostatic enlargement.  Other: Small umbilical hernia containing only fat. In addition, there is a small left inguinal hernia containing fat. There is a small knuckle of small bowel which extends towards the base of this hernia, although does not extend into it.  Musculoskeletal: No acute or significant osseous findings. There is fairly extensive lower lumbar spondylosis.  IMPRESSION: 1. Suspected multifocal colon cancer involving the cecum and right colon with resulting obstruction of the appendiceal lumen. No evidence of bowel obstruction or perforation. 2.  Widespread hepatic metastatic disease with nodal metastases in the ileocolonic mesentery. 3. Emphysema, mild atherosclerosis and lumbar spondylosis noted. 4. These results will be called to the ordering clinician or representative by the Radiologist Assistant, and communication documented in the PACS or zVision Dashboard.   Electronically Signed   By: Richardean Sale M.D.   On: 07/27/2015 08:52    ECG  Sinus tachycardia, 113, LAD, LAE, LVH, poor R progression - not significantly changed from 8/8 ecg.  ASSESSMENT AND PLAN  1.  Elevated troponin/Sinus Tachycardia:  Pt was noted to be tachycardic on 9/12 in the setting of mild dyspnea and dizziness.  Troponin has been found to be elevated @ 0.10 this AM.  He has not had c/p.  CTA of the chest did not show PE.  No mention of coronary calcifications.  Suspect troponin elevation is secondary  to demand ischemia in the setting of sinus tachycardia, which is likely a result of dehydration.  We will check an echo to eval LV fxn and r/o valvular abnormalities.  He does have a systolic murmur and his PMH indicates that he has a h/o moderate MR (? Echo in 2012 @ [facility] - no report available).  Check one more troponin to assess trend.  Provided that echo shows nl EF and troponin trend is flat, I would not pursue any additional ischemic evaluation at this time given recent surgery and metastatic cancer pending further oncologic evaluation.  With h/o IDA and recent surgery, would not add ASA at this time.  No BB in setting of soft bp's and probable orthostasis.  2.  Orthostatic dizziness:  See above.  Suspect dehydration and orthostatic hypotension is driving issue.  Check echo and orthostatic VS.  3.  Metastatic Colon CA with hepatic metastases:  S/p R lap colectomy.  Pending further outpt oncology eval.  4.  IDA:  H/H stable.  Has received outpt Fe infusions.  5.  HTN:  On nifedipine and lisinopril.  Check orthostatics.  We may need to hold these.  6.  CKD II-III:   Stable.  On lisinopril.  7.  Type II DM/Hyperglycemia:  Per IM.  8.  Leukocytosis:  Improving.  No evidence of infxn on CT of chest.  UA negative.  9.  HL:  On statin.  Nl lft's 8/25.  Signed, Murray Hodgkins, NP 08/18/2015, 10:02 AM

## 2015-08-19 ENCOUNTER — Encounter: Payer: Self-pay | Admitting: Nurse Practitioner

## 2015-08-19 ENCOUNTER — Inpatient Hospital Stay: Payer: Medicare PPO

## 2015-08-19 ENCOUNTER — Inpatient Hospital Stay: Payer: Medicare PPO | Admitting: Oncology

## 2015-08-19 LAB — CBC
HCT: 29.7 % — ABNORMAL LOW (ref 40.0–52.0)
Hemoglobin: 9.4 g/dL — ABNORMAL LOW (ref 13.0–18.0)
MCH: 21.9 pg — ABNORMAL LOW (ref 26.0–34.0)
MCHC: 31.8 g/dL — ABNORMAL LOW (ref 32.0–36.0)
MCV: 68.9 fL — ABNORMAL LOW (ref 80.0–100.0)
Platelets: 419 K/uL (ref 150–440)
RBC: 4.31 MIL/uL — ABNORMAL LOW (ref 4.40–5.90)
RDW: 30.1 % — ABNORMAL HIGH (ref 11.5–14.5)
WBC: 26.3 K/uL — ABNORMAL HIGH (ref 3.8–10.6)

## 2015-08-19 LAB — GLUCOSE, CAPILLARY
GLUCOSE-CAPILLARY: 113 mg/dL — AB (ref 65–99)
Glucose-Capillary: 103 mg/dL — ABNORMAL HIGH (ref 65–99)
Glucose-Capillary: 91 mg/dL (ref 65–99)
Glucose-Capillary: 64 mg/dL — ABNORMAL LOW (ref 65–99)
Glucose-Capillary: 138 mg/dL — ABNORMAL HIGH (ref 65–99)

## 2015-08-19 LAB — BASIC METABOLIC PANEL WITH GFR
Anion gap: 11 (ref 5–15)
BUN: 27 mg/dL — ABNORMAL HIGH (ref 6–20)
CO2: 23 mmol/L (ref 22–32)
Calcium: 9.2 mg/dL (ref 8.9–10.3)
Chloride: 98 mmol/L — ABNORMAL LOW (ref 101–111)
Creatinine, Ser: 1.3 mg/dL — ABNORMAL HIGH (ref 0.61–1.24)
GFR calc Af Amer: 60 mL/min — ABNORMAL LOW
GFR calc non Af Amer: 52 mL/min — ABNORMAL LOW
Glucose, Bld: 113 mg/dL — ABNORMAL HIGH (ref 65–99)
Potassium: 4.5 mmol/L (ref 3.5–5.1)
Sodium: 132 mmol/L — ABNORMAL LOW (ref 135–145)

## 2015-08-19 LAB — MAGNESIUM: Magnesium: 2.1 mg/dL (ref 1.7–2.4)

## 2015-08-19 MED ORDER — DEXTROSE-NACL 5-0.45 % IV SOLN
INTRAVENOUS | Status: DC
  Administered 2015-08-19 – 2015-08-21 (×5): via INTRAVENOUS
  Administered 2015-08-21: 1000 mL via INTRAVENOUS

## 2015-08-19 MED ORDER — PANTOPRAZOLE SODIUM 40 MG IV SOLR
40.0000 mg | INTRAVENOUS | Status: DC
  Administered 2015-08-19 – 2015-08-28 (×8): 40 mg via INTRAVENOUS
  Filled 2015-08-19 (×8): qty 40

## 2015-08-19 MED ORDER — SODIUM CHLORIDE 0.9 % IV SOLN
INTRAVENOUS | Status: DC
  Administered 2015-08-19: 16:00:00 via INTRAVENOUS

## 2015-08-19 MED ORDER — DEXTROSE 50 % IV SOLN
1.0000 | Freq: Once | INTRAVENOUS | Status: AC
  Administered 2015-08-19: 50 mL via INTRAVENOUS
  Filled 2015-08-19: qty 50

## 2015-08-19 NOTE — Progress Notes (Signed)
Patient Name: Aaron Allen Date of Encounter: 08/19/2015    Principal Problem:   Cancer of right colon Active Problems:   Metastatic colon cancer to liver   Hypertension   Anemia, iron deficiency   Diabetes mellitus without complication   CKD (chronic kidney disease), stage III   Elevated troponin   HLD (hyperlipidemia)    SUBJECTIVE  No c/p or sob.  No further dizziness.  C/o abd bloating and feeling "gassy" this AM.  Belching frequently.  CURRENT MEDS . enoxaparin (LOVENOX) injection  40 mg Subcutaneous Q24H  . glipiZIDE  10 mg Oral BID AC  . insulin aspart  0-5 Units Subcutaneous QHS  . insulin aspart  0-9 Units Subcutaneous TID WC  . insulin glargine  10 Units Subcutaneous QHS  . lisinopril  40 mg Oral Daily  . NIFEdipine  90 mg Oral Daily  . pantoprazole  40 mg Oral BID  . pravastatin  40 mg Oral QHS  . senna  1 tablet Oral BID    OBJECTIVE  Filed Vitals:   08/18/15 0512 08/18/15 1211 08/18/15 2242 08/19/15 0611  BP: 107/66 110/62 136/66 129/64  Pulse: 107 95 107 99  Temp: 98.5 F (36.9 C) 98.7 F (37.1 C) 98.1 F (36.7 C) 98.5 F (36.9 C)  TempSrc: Oral Oral Oral Oral  Resp: 20 18 20 20   Height:      Weight:      SpO2: 96% 96% 97% 96%    Intake/Output Summary (Last 24 hours) at 08/19/15 0810 Last data filed at 08/19/15 0600  Gross per 24 hour  Intake 2671.42 ml  Output   2250 ml  Net 421.42 ml   Filed Weights   08/13/15 1023  Weight: 173 lb (78.472 kg)    PHYSICAL EXAM  General: Pleasant, NAD. Neuro: Alert and oriented X 3. Moves all extremities spontaneously. Psych: Normal affect. HEENT:  Normal  Neck: Supple without bruits or JVD. Lungs:  Resp regular and unlabored, diminished breath sounds @ bases - improved some since yesterday. Heart: RRR no s3, s4, 2/6 syst murmur @ llsb. Abdomen: diffusely tender, distended, BS + x 4.  Extremities: No clubbing, cyanosis or edema. DP/PT/Radials 2+ and equal bilaterally.  Accessory Clinical  Findings  CBC  Recent Labs  08/17/15 0441 08/19/15 0420  WBC 18.8* 26.3*  HGB 9.3* 9.4*  HCT 29.7* 29.7*  MCV 67.8* 68.9*  PLT 389 970   Basic Metabolic Panel  Recent Labs  08/17/15 2015 08/18/15 0542 08/19/15 0420  NA 129* 129* 132*  K 4.9 4.5 4.5  CL 92* 94* 98*  CO2 27 24 23   GLUCOSE 158* 187* 113*  BUN 24* 24* 27*  CREATININE 1.39* 1.28* 1.30*  CALCIUM 9.1 9.1 9.2  MG 1.8  --  2.1   Cardiac Enzymes  Recent Labs  08/17/15 2017 08/18/15 0542 08/18/15 1253  TROPONINI <0.03 0.10* <0.03   Thyroid Function Tests  Recent Labs  08/18/15 0542  TSH 3.374    TELE  Rsr, sinus tach, pac's - 80's to low 100's.  Radiology/Studies  Ct Angio Chest Pe W/cm &/or Wo Cm  08/17/2015   CLINICAL DATA:  Shortness breath, personal history of colon cancer  EXAM: CT ANGIOGRAPHY CHEST WITH CONTRAST  TECHNIQUE: Multidetector CT imaging of the chest was performed using the standard protocol during bolus administration of intravenous contrast. Multiplanar CT image reconstructions and MIPs were obtained to evaluate the vascular anatomy.  CONTRAST:  150mL OMNIPAQUE IOHEXOL 350 MG/ML SOLN  COMPARISON:  None.  FINDINGS: Bilateral lower lobe atelectasis. 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 No pleural or pericardial effusion. Heart size upper normal. Distal esophagus shows wall thickening.  Thoracic aorta demonstrates no dissection or dilatation. No filling defects in the pulmonary arterial system.  Images through the upper abdomen demonstrate partial visualization of innumerable peripherally enhancing low-attenuation liver lesions.  There are no acute musculoskeletal findings.  Review of the MIP images confirms the above findings.  IMPRESSION: 1. No evidence of pulmonary embolism 2. 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   On: 08/17/2015 21:40   Ct Abdomen  Pelvis W Contrast  07/27/2015   CLINICAL DATA:  Initial staging for recently diagnosed colon cancer. History of diabetes, hypertension and chronic kidney disease. Initial encounter.  EXAM: CT ABDOMEN AND PELVIS WITH CONTRAST  TECHNIQUE: Multidetector CT imaging of the abdomen and pelvis was performed using the standard protocol following bolus administration of intravenous contrast.  CONTRAST:  1109mL OMNIPAQUE IOHEXOL 300 MG/ML  SOLN  COMPARISON:  None.  FINDINGS: Lower chest: Panacinar emphysema noted at both lung bases. No significant pleural or pericardial effusion.  Hepatobiliary: There are innumerable heterogeneously enhancing lesions throughout the liver consistent with widespread metastatic disease. A representative lesion centrally in the right hepatic lobe measures 2.4 x 2.4 cm on image 18. The gallbladder is incompletely distended, likely accounting for mild prominence of its wall. No evidence of gallstones or biliary dilatation.  Pancreas: No suspicious findings. Probable 8 mm cyst within the uncinate process on image number 30. There is no pancreatic ductal dilatation.  Spleen: Normal in size without focal abnormality.  Adrenals/Urinary Tract: Both adrenal glands appear normal. The kidneys appear normal without evidence of urinary tract calculus, suspicious lesion or hydronephrosis. No bladder abnormalities are seen.  Stomach/Bowel: There is a large infiltrating and partially constricting mass near the hepatic flexure of the colon. There is a possible second infiltrating mass more proximally in the cecum near the ileocecal valve. The terminal ileum and ileocecal valve appear normal, however, there is dilatation of the appendix which is fluid-filled. Contrast passes into the distal colon. There is no evidence of bowel obstruction.  Vascular/Lymphatic: There are several enlarged ileocolonic mesenteric lymph nodes consistent with nodal metastases. The largest of these measures 2.6 x 1.4 cm on image 38. No  retroperitoneal or pelvic lymphadenopathy. Prominent inguinal lymph nodes have preserved fatty hila, likely reactive. Mild aortoiliac atherosclerosis.  Reproductive: Mild prostatic enlargement.  Other: Small umbilical hernia containing only fat. In addition, there is a small left inguinal hernia containing fat. There is a small knuckle of small bowel which extends towards the base of this hernia, although does not extend into it.  Musculoskeletal: No acute or significant osseous findings. There is fairly extensive lower lumbar spondylosis.  IMPRESSION: 1. Suspected multifocal colon cancer involving the cecum and right colon with resulting obstruction of the appendiceal lumen. No evidence of bowel obstruction or perforation. 2. Widespread hepatic metastatic disease with nodal metastases in the ileocolonic mesentery. 3. Emphysema, mild atherosclerosis and lumbar spondylosis noted. 4. These results will be called to the ordering clinician or representative by the Radiologist Assistant, and communication documented in the PACS or zVision Dashboard.   Electronically Signed   By: Richardean Sale M.D.   On: 07/27/2015 08:52   2D Echocardiogram 9.14.2016  Study Conclusions  - Left ventricle: The cavity size was normal. Wall thickness  was normal. Systolic function was normal. The estimated ejection fraction was in the range of 55% to 60%. Wall motion was normal; there were no regional wall motion abnormalities. Doppler parameters are consistent with abnormal left ventricular relaxation (grade 1 diastolic dysfunction). - Ventricular septum: Septal motion showed abnormal function and mild dyssynergy. These changes are consistent with intraventricular conduction delay. - Aortic valve: Mild focal calcification, nodularity, and sclerosis without stenosis involving the left coronary and noncoronary cusp, consistent with sclerosis. Valve area (Vmax): 2.28 cm^2. - Mitral valve: There was no  significant regurgitation. - Left atrium: The atrium was moderately to severely dilated. _____________   ASSESSMENT AND PLAN  1. Elevated troponin/Sinus Tachycardia: Pt was noted to be tachycardic on 9/13 in the setting of mild dyspnea and dizziness. Troponin was elevated @ 0.10 but subsequent returned normal.  He has not had c/p. CTA of the chest did not show PE. No mention of coronary calcifications. Echo on 9/14 showed nl LV fxn w/o reg wma's and grade 1 diastolic dysfxn. No evidence of MR (prev report of mod MR).  Troponin elevation likely spurious.  No further cardiac w/u planned.    2. Orthostatic dizziness: Orthostatic VS ordered yesterday but do not appear to have been done.  HR's have remained elevated. Suspect dehydration and orthostatic hypotension is driving issue.   3. Metastatic Colon CA with hepatic metastases: S/p R lap colectomy. Pending further outpt oncology eval.  4. IDA: H/H stable. Has received outpt Fe infusions.  5. HTN: On nifedipine and lisinopril. Check orthostatics. We may need to hold these.  6. CKD II-III: Stable. On lisinopril.  7. Type II DM/Hyperglycemia: Per IM.  8. Leukocytosis: WBC up this AM.  Afebrile. No evidence of infxn on CT of chest. UA negative.   9. HL: On statin. Nl lft's 8/25.  Signed, Murray Hodgkins NP

## 2015-08-19 NOTE — Progress Notes (Signed)
I reviewed his x-ray which was consistent with small intestinal obstruction.  There was marked dilatation of the small bowel.  Minimal amount of gas in the colon.  On further   examination I again noted he had epigastric distention and tympany..  The nurse inserted a 16 gauge nasogastric tube draining 2 L of brown liquid.  No blood was seen.  His abdomen was obviously less distended.   IMPRESSION:  small-bowel obstruction  Plan is continue nasogastric suction at present.  Ambulate with walker

## 2015-08-19 NOTE — Progress Notes (Signed)
Notified Dr. Tamala Julian in reference to pt's CBG 64. MD ordered 1amp of D50 and changed fluids to D5 1/2 at 172mL/hr

## 2015-08-19 NOTE — Progress Notes (Signed)
Abdominal x-ray not yet done.  Will reorder.

## 2015-08-19 NOTE — Progress Notes (Signed)
[facility] [place] NAME: Aaron Allen    MR#:  [id number]  DATE OF BIRTH:  1941-05-23  SUBJECTIVE:  CHIEF COMPLAINT:  No chief complaint on file.  No complaint. He denies any chest pain, palpitation or shortness of breath. No abdominal pain, nausea, vomiting or diarrhea.  REVIEW OF SYSTEMS:  CONSTITUTIONAL: No fever, fatigue or weakness.  EYES: No blurred or double vision.  EARS, NOSE, AND THROAT: No tinnitus or ear pain.  RESPIRATORY: No cough, shortness of breath, wheezing or hemoptysis.  CARDIOVASCULAR: No chest pain, orthopnea, edema.  GASTROINTESTINAL: No nausea, vomiting, diarrhea or abdominal pain.  GENITOURINARY: No dysuria, hematuria.  ENDOCRINE: No polyuria, nocturia,  HEMATOLOGY: No anemia, easy bruising or bleeding SKIN: No rash or lesion. MUSCULOSKELETAL: No joint pain or arthritis.   NEUROLOGIC: No tingling, numbness, weakness.  PSYCHIATRY: No anxiety or depression.   DRUG ALLERGIES:  No Known Allergies  VITALS:  Blood pressure 129/73, pulse 125, temperature 98.1 F (36.7 C), temperature source Oral, resp. rate 20, height 5\' 9"  (1.753 m), weight 78.472 kg (173 lb), SpO2 100 %.  PHYSICAL EXAMINATION:  GENERAL:  74 y.o.-year-old patient lying in the bed with no acute distress.  EYES: Pupils equal, round, reactive to light and accommodation. No scleral icterus. Extraocular muscles intact.  HEENT: Head atraumatic, normocephalic. Oropharynx and nasopharynx clear.  NECK:  Supple, no jugular venous distention. No thyroid enlargement, no tenderness.  LUNGS: Normal breath sounds bilaterally, no wheezing, mild crackles on right side. No use of accessory muscles of respiration.  CARDIOVASCULAR: S1, S2 normal. No murmurs, rubs, or gallops.  ABDOMEN: Soft, nontender, nondistended. Bowel sounds present. No organomegaly or mass.  EXTREMITIES: No pedal edema, cyanosis, or clubbing.  NEUROLOGIC: Cranial nerves II through XII  are intact. Muscle strength 5/5 in all extremities. Sensation intact. Gait not checked.  PSYCHIATRIC: [name].  SKIN: No obvious rash, lesion, or ulcer.    LABORATORY PANEL:   CBC  Recent Labs Lab 08/19/15 0420  WBC 26.3*  HGB 9.4*  HCT 29.7*  PLT 419   ------------------------------------------------------------------------------------------------------------------  Chemistries   Recent Labs Lab 08/19/15 0420  NA 132*  K 4.5  CL 98*  CO2 23  GLUCOSE 113*  BUN 27*  CREATININE 1.30*  CALCIUM 9.2  MG 2.1   ------------------------------------------------------------------------------------------------------------------  Cardiac Enzymes  Recent Labs Lab 08/18/15 1253  TROPONINI <0.03   ------------------------------------------------------------------------------------------------------------------  RADIOLOGY:  Ct Angio Chest Pe W/cm &/or Wo Cm  08/17/2015   CLINICAL DATA:  Shortness breath, personal history of colon cancer  EXAM: CT ANGIOGRAPHY CHEST WITH CONTRAST  TECHNIQUE: Multidetector CT imaging of the chest was performed using the standard protocol during bolus administration of intravenous contrast. Multiplanar CT image reconstructions and MIPs were obtained to evaluate the vascular anatomy.  CONTRAST:  175mL OMNIPAQUE IOHEXOL 350 MG/ML SOLN  COMPARISON:  None.  FINDINGS: Bilateral lower lobe atelectasis. 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 No pleural or pericardial effusion. Heart size upper normal. Distal esophagus shows wall thickening.  Thoracic aorta demonstrates no dissection or dilatation. No filling defects in the pulmonary arterial system.  Images through the upper abdomen demonstrate partial visualization of innumerable peripherally enhancing low-attenuation liver lesions.  There are no acute musculoskeletal findings.  Review of the MIP images confirms the above findings.  IMPRESSION: 1. No  evidence of pulmonary embolism 2. 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 Widespread metastatic disease on the  liver   Electronically Signed   By: Skipper Cliche M.D.   On: 08/17/2015 21:40   Dg Abd 2 Views  08/19/2015   CLINICAL DATA:  Recent laparoscopic right hemicolectomy 08/13/2015. Nausea, vomiting all night. 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  EXAM: ABDOMEN - 2 VIEW  COMPARISON:  CT of the abdomen and pelvis 07/27/2015  FINDINGS: Free intraperitoneal air is identified beneath the right hemidiaphragm.  There is dilatation of numerous central small bowel loops consistent with high-grade small bowel obstruction. Loops measure up to 5.7 cm. Gas is identified within nondilated loops of large bowel. Degenerative changes are seen in the lumbar spine.  IMPRESSION: 1. Small amount of free free intraperitoneal air consistent with recent surgery. 2. High-grade small bowel obstruction. 3. The salient findings were discussed with Judeen Hammans, RN, in the office of JARVIS SMITH on 08/19/2015 at 2:08 pm.   Electronically Signed   By: Nolon Nations M.D.   On: 08/19/2015 14:09    EKG:   Orders placed or performed during the hospital encounter of 08/13/15  . EKG 12-Lead  . EKG 12-Lead  . EKG 12-Lead  . EKG 12-Lead    ASSESSMENT AND PLAN:   1. Palpitations and tachycardia with some shortness of breath. Improved.  CT angiogram of the chest that didn't show any PE but showed emphysema.  2. Elevated troponin. Possible due to demanding ischemia due to 1. Continue aspirin and statin, Jude Naclerio down from 0.1 to less than 0.03 today. Follow-up cardiograph. Per cardiologist consult, no further cardiac workup at this time.  3.Diabetes type 2. Stable. continue sliding scale and Lantus .   4. Tobacco abuse. The patient has been chewing tobacco. Smoking cessation was counseled for 3-4 minutes. 5. Hypertension. Controlled, continue hypertension medication.   * Hyponatremia. Improving, continue   normal saline IV, follow-up BMP.   *. CKD stage III, stable.  *. Leukocytosis. Urinalysis is negative, CT chest didn't show any infiltrates. Unclear itiology. Follow-up CBC.  * History of carcinoma the right colon who underwent a laparoscopic right colectomy. Follow-up surgeon.  All the records are reviewed and case discussed with Care Management/Social Workerr. Management plans discussed with the patient, his daughter and they are in agreement. Greater than 50% of time spent in coordination of care.  CODE STATUS: full code  TOTAL TIME TAKING CARE OF THIS PATIENT: 36 minutes.   DC plan per surgeon, DEPENDING ON CLINICAL CONDITION.   Demetrios Loll M.D on 08/19/2015 at 2:41 PM  Between 7am to 6pm - Pager - [phone]  After 6pm go to [email or web address] - password [email or web address] [facility]  Office  [phone]  CC: Primary care physician; Dion Body, MD

## 2015-08-19 NOTE — Progress Notes (Signed)
CBG rechecked  After 1amp of D50 given as ordered.  BSL now 138. Will cont to monitor pt.

## 2015-08-19 NOTE — Progress Notes (Signed)
He reports several episodes of vomiting during the night.  He reports no abdominal pain.  He has continued to pass gas.  He did have multiple bowel movements yesterday.  He reports  He feels weak and has not been walking.  Vital signs are stable.  He is awake alert and oriented.  Lung sounds are clear.  Abdomen is with tympany in the epigastrium and mild distention.  Abdomen is nontender.  Incisions are healing satisfactorily.  Lab work noted white blood count is again elevated at 26,000, hemoglobin 9.4 creatinine 1.3, blood sugar 113  Impression possible gastroparesis  Plan is to keep NPO   except for sips of water and medicine.  Order x-ray of abdomen

## 2015-08-20 LAB — GLUCOSE, CAPILLARY
GLUCOSE-CAPILLARY: 173 mg/dL — AB (ref 65–99)
Glucose-Capillary: 121 mg/dL — ABNORMAL HIGH (ref 65–99)
Glucose-Capillary: 117 mg/dL — ABNORMAL HIGH (ref 65–99)
Glucose-Capillary: 171 mg/dL — ABNORMAL HIGH (ref 65–99)
Glucose-Capillary: 163 mg/dL — ABNORMAL HIGH (ref 65–99)

## 2015-08-20 LAB — CBC
HCT: 28.8 % — ABNORMAL LOW (ref 40.0–52.0)
Hemoglobin: 9.1 g/dL — ABNORMAL LOW (ref 13.0–18.0)
MCH: 21.8 pg — ABNORMAL LOW (ref 26.0–34.0)
MCHC: 31.8 g/dL — AB (ref 32.0–36.0)
MCV: 68.6 fL — AB (ref 80.0–100.0)
PLATELETS: 447 10*3/uL — AB (ref 150–440)
RBC: 4.2 MIL/uL — ABNORMAL LOW (ref 4.40–5.90)
RDW: 29.8 % — AB (ref 11.5–14.5)
WBC: 19.2 10*3/uL — AB (ref 3.8–10.6)

## 2015-08-20 LAB — BASIC METABOLIC PANEL
Anion gap: 9 (ref 5–15)
BUN: 25 mg/dL — AB (ref 6–20)
CO2: 23 mmol/L (ref 22–32)
Calcium: 8.8 mg/dL — ABNORMAL LOW (ref 8.9–10.3)
Chloride: 102 mmol/L (ref 101–111)
Creatinine, Ser: 1.26 mg/dL — ABNORMAL HIGH (ref 0.61–1.24)
GFR calc Af Amer: >60 mL/min (ref >60)
GFR, EST NON AFRICAN AMERICAN: 54 mL/min — AB (ref >60)
GLUCOSE: 128 mg/dL — AB (ref 65–99)
Potassium: 4 mmol/L (ref 3.5–5.1)
SODIUM: 134 mmol/L — AB (ref 135–145)

## 2015-08-20 NOTE — Progress Notes (Signed)
[place] [facility] [place] NAME: Aaron Allen    MR#:  [id number]  DATE OF BIRTH:  November 22, 1941  SUBJECTIVE:  CHIEF COMPLAINT:  No chief complaint on file.  No complaint. He denies any chest pain, palpitation or shortness of breath. No abdominal pain, nausea, vomiting or diarrhea. On NGT suction.  REVIEW OF SYSTEMS:  CONSTITUTIONAL: No fever, fatigue or weakness.  EYES: No blurred or double vision.  EARS, NOSE, AND THROAT: No tinnitus or ear pain.  RESPIRATORY: No cough, shortness of breath, wheezing or hemoptysis.  CARDIOVASCULAR: No chest pain, orthopnea, edema.  GASTROINTESTINAL: No nausea, vomiting, diarrhea or abdominal pain.  GENITOURINARY: No dysuria, hematuria.  ENDOCRINE: No polyuria, nocturia,  HEMATOLOGY: No anemia, easy bruising or bleeding SKIN: No rash or lesion. MUSCULOSKELETAL: No joint pain or arthritis.   NEUROLOGIC: No tingling, numbness, weakness.  PSYCHIATRY: No anxiety or depression.   DRUG ALLERGIES:  No Known Allergies  VITALS:  Blood pressure 141/76, pulse 96, temperature 97.6 F (36.4 C), temperature source Oral, resp. rate 17, height 5\' 9"  (1.753 m), weight 78.472 kg (173 lb), SpO2 98 %.  PHYSICAL EXAMINATION:  GENERAL:  74 y.o.-year-old patient lying in the bed with no acute distress.  EYES: Pupils equal, round, reactive to light and accommodation. No scleral icterus. Extraocular muscles intact.  HEENT: Head atraumatic, normocephalic. Oropharynx and nasopharynx clear. Moist oral mucosa. NECK:  Supple, no jugular venous distention. No thyroid enlargement, no tenderness.  LUNGS: Normal breath sounds bilaterally, no wheezing, mild crackles on right side. No use of accessory muscles of respiration.  CARDIOVASCULAR: S1, S2 normal. No murmurs, rubs, or gallops.  ABDOMEN: Soft, mild abdominal tenderness, nondistended. Bowel sounds present. No organomegaly or mass.  EXTREMITIES: No pedal edema, cyanosis, or  clubbing.  NEUROLOGIC: Cranial nerves II through XII are intact. Muscle strength 4/5 in all extremities. Sensation intact. Gait not checked.  PSYCHIATRIC: [name].  SKIN: No obvious rash, lesion, or ulcer.    LABORATORY PANEL:   CBC  Recent Labs Lab 08/20/15 0610  WBC 19.2*  HGB 9.1*  HCT 28.8*  PLT 447*   ------------------------------------------------------------------------------------------------------------------  Chemistries   Recent Labs Lab 08/19/15 0420 08/20/15 0610  NA 132* 134*  K 4.5 4.0  CL 98* 102  CO2 23 23  GLUCOSE 113* 128*  BUN 27* 25*  CREATININE 1.30* 1.26*  CALCIUM 9.2 8.8*  MG 2.1  --    ------------------------------------------------------------------------------------------------------------------  Cardiac Enzymes  Recent Labs Lab 08/18/15 1253  TROPONINI <0.03   ------------------------------------------------------------------------------------------------------------------  RADIOLOGY:  Dg Abd 2 Views  08/19/2015   CLINICAL DATA:  Recent laparoscopic right hemicolectomy 08/13/2015. Nausea, vomiting all night. 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  EXAM: ABDOMEN - 2 VIEW  COMPARISON:  CT of the abdomen and pelvis 07/27/2015  FINDINGS: Free intraperitoneal air is identified beneath the right hemidiaphragm.  There is dilatation of numerous central small bowel loops consistent with high-grade small bowel obstruction. Loops measure up to 5.7 cm. Gas is identified within nondilated loops of large bowel. Degenerative changes are seen in the lumbar spine.  IMPRESSION: 1. Small amount of free free intraperitoneal air consistent with recent surgery. 2. High-grade small bowel obstruction. 3. The salient findings were discussed with Aaron Hammans, RN, in the office of Aaron Allen on 08/19/2015 at 2:08 pm.   Electronically Signed   By: Nolon Nations M.D.   On: 08/19/2015 14:09    EKG:   Orders  placed or  performed during the hospital encounter of 08/13/15  . EKG 12-Lead  . EKG 12-Lead    ASSESSMENT AND PLAN:   1. Palpitations and tachycardia with some shortness of breath. Improved.  CT angiogram of the chest that didn't show any PE but showed emphysema.  2. Elevated troponin. Possible due to demanding ischemia due to 1. Continue aspirin and statin, Chen down from 0.1 to less than 0.03 today. Follow-up cardiograph. Per cardiologist consult, no further cardiac workup at this time.  3.Diabetes type 2. Stable. continue sliding scale and Lantus .   4. Tobacco abuse. The patient has been chewing tobacco. Smoking cessation was counseled for 3-4 minutes. 5. Hypertension. Controlled, continue hypertension medication.   * Hyponatremia. Improved. *. CKD stage III, stable.  *. Leukocytosis. Urinalysis is negative, CT chest didn't show any infiltrates. Unclear itiology. Improving, Follow-up CBC.  * carcinoma the right colon with liver metastasis, postoperative small-bowel obstruction.  continue NG suction and intravenous hydrationper Dr. Tamala Julian.  Physical therapy suggest skilled nursing facility placement. [name]. Discharge planning per Dr. Tamala Julian. I will sign off.   All the records are reviewed and case discussed with Care Management/Social Workerr. Management plans discussed with the patient, his daughter and they are in agreement. Greater than 50% of time spent in coordination of care.  CODE STATUS: full code  TOTAL TIME TAKING CARE OF THIS PATIENT: 36 minutes.   DC plan per surgeon, DEPENDING ON CLINICAL CONDITION.   Aaron Allen M.D on 08/20/2015 at 3:15 PM  Between 7am to 6pm - Pager - [phone]  After 6pm go to [email or web address] - password [email or web address] [facility]  Office  [phone]  CC: Primary care physician; Aaron Body, MD

## 2015-08-20 NOTE — Evaluation (Signed)
Physical Therapy Evaluation Patient Details Name: Aaron Allen MRN: [id number] DOB: 1941-06-03 Today's Date: 08/20/2015   History of Present Illness  74 yo male with onset of laparoscopic colectomy for treatment of mass on colon, with leukocytosis and tachycardia, demand ischemia and elevated troponin.   PMHx:  CKD 3, DM,   Clinical Impression  Pt is demonstrating some willingness to increase his strength and ROM in LE's along with endurance walking, but is very weak from recent surgery with NG drain still in place.  His plan is to gradually increase activity and will look for him to go to SNF to manage strengthening as he is home alone with stairs in and out of house.    Follow Up Recommendations SNF    Equipment Recommendations  Rolling walker with 5" wheels    Recommendations for Other Services       Precautions / Restrictions Precautions Precautions: Fall (telemetry) Restrictions Weight Bearing Restrictions: No      Mobility  Bed Mobility               General bed mobility comments: up when PT entered  Transfers Overall transfer level: Needs assistance Equipment used: Rolling walker (2 wheeled);1 person hand held assist Transfers: Sit to/from [facility] Sit to Stand: Min assist;Mod assist Stand pivot transfers: Min assist       General transfer comment: remidners 100% of the time for hand placement  Ambulation/Gait Ambulation/Gait assistance: Min assist Ambulation Distance (Feet): 300 Feet Assistive device: Rolling walker (2 wheeled);1 person hand held assist Gait Pattern/deviations: Step-through pattern;Trunk flexed;Narrow base of support Gait velocity: reduced Gait velocity interpretation: Below normal speed for age/gender General Gait Details: slow progression to stopping for a rest with narow base of support and difficulty turning walker  Stairs            Wheelchair Mobility    Modified Rankin (Stroke Patients Only)        Balance Overall balance assessment: Needs assistance   Sitting balance-Leahy Scale: Fair       Standing balance-Leahy Scale: Poor                               Pertinent Vitals/Pain Pain Assessment: Faces Pain Score: 4  Faces Pain Scale: Hurts little more Pain Location: abdomen Pain Descriptors / Indicators: Operative site guarding Pain Intervention(s): Limited activity within patient's tolerance;Monitored during session;Premedicated before session;Repositioned    Home Living Family/patient expects to be discharged to:: Private residence Living Arrangements: Alone Available Help at Discharge: Family;Available PRN/intermittently Type of Home: House Home Access: Stairs to enter Entrance Stairs-Rails: Right Entrance Stairs-Number of Steps: 3 Home Layout: Two level;Other (Comment) (split level with one step to transition) Home Equipment: None      Prior Function Level of Independence: Independent               Hand Dominance   Dominant Hand: Right    Extremity/Trunk Assessment   Upper Extremity Assessment: Overall WFL for tasks assessed           Lower Extremity Assessment: Generalized weakness      Cervical / Trunk Assessment: Normal  Communication   Communication: No difficulties  Cognition Arousal/Alertness: Awake/alert Behavior During Therapy: WFL for tasks assessed/performed Overall Cognitive Status: Within Functional Limits for tasks assessed                      General Comments General comments (  skin integrity, edema, etc.): Has some low endurance with rest needed but does complete the walk around the desk at his insistence, and then was tired.    Exercises        Assessment/Plan    PT Assessment Patient needs continued PT services  PT Diagnosis Difficulty walking;Generalized weakness   PT Problem List Decreased strength;Decreased range of motion;Decreased activity tolerance;Decreased balance;Decreased  mobility;Decreased coordination;Decreased knowledge of use of DME;Decreased safety awareness;Cardiopulmonary status limiting activity;Decreased skin integrity;Pain  PT Treatment Interventions DME instruction;Gait training;Stair training;Functional mobility training;Therapeutic activities;Therapeutic exercise;Balance training;Neuromuscular re-education;Patient/family education   PT Goals (Current goals can be found in the Care Plan section) Acute Rehab PT Goals Patient Stated Goal: to walk with more energy PT Goal Formulation: With patient/family Time For Goal Achievement: 09/03/15 Potential to Achieve Goals: Good    Frequency Min 2X/week   Barriers to discharge Inaccessible home environment;Decreased caregiver support home alone with stairs in and out    Co-evaluation               End of Session Equipment Utilized During Treatment: Gait belt Activity Tolerance: Patient tolerated treatment well Patient left: in chair;with call bell/phone within reach;with chair alarm set;with family/visitor present Nurse Communication: Mobility status         Time: 1219-7588 PT Time Calculation (min) (ACUTE ONLY): 30 min   Charges:   PT Evaluation $Initial PT Evaluation Tier I: 1 Procedure PT Treatments $Gait Training: 8-22 mins   PT G CodesRamond Dial 09-07-2015, 2:56 PM   Mee Hives, PT MS Acute Rehab Dept. Number: [phone] [phone] and [place] [phone]

## 2015-08-20 NOTE — Progress Notes (Signed)
Nutrition Follow-up    INTERVENTION:  Coordination of care: Await diet progression   NUTRITION DIAGNOSIS:   Inadequate oral intake related to acute illness as evidenced by NPO.    GOAL:   Patient will meet greater than or equal to 90% of their needs    MONITOR:    (Energy Intake, Glucose Profile, Anthropometrics, Digestive System)  REASON FOR ASSESSMENT:   Malnutrition Screening Tool    ASSESSMENT:   Pt admitted for lap right colectomy 08/13/2015 secondary to colon cancer found during colonoscopy. Per MD note, CT also consistent with hepatic metasis.   Pt vomited yesterday, NG re-inserted   Current Nutrition: NPO, tolerating full liquids until vomiting  Yesterday  Gastrointestinal Profile:NG with output, walking, abdomen soft per MD note Last BM:    Medications: D5 1/2 NS at 170ml/hr  Electrolyte/Renal Profile and Glucose Profile:   Recent Labs Lab 08/17/15 2015 08/18/15 0542 08/19/15 0420 08/20/15 0610  NA 129* 129* 132* 134*  K 4.9 4.5 4.5 4.0  CL 92* 94* 98* 102  CO2 27 24 23 23   BUN 24* 24* 27* 25*  CREATININE 1.39* 1.28* 1.30* 1.26*  CALCIUM 9.1 9.1 9.2 8.8*  MG 1.8  --  2.1  --   GLUCOSE 158* 187* 113* 128*      Weight Trend since Admission: Filed Weights   08/13/15 1023  Weight: 173 lb (78.472 kg)     Diet Order:  Diet NPO time specified Except for: Other (See Comments)  Skin:   reviewed  Last BM:  08/13/2015  Height:   Ht Readings from Last 1 Encounters:  08/13/15 5\' 9"  (1.753 m)    Weight:   Wt Readings from Last 1 Encounters:  08/13/15 173 lb (78.472 kg)       BMI:  Body mass index is 25.54 kg/(m^2).  Estimated Nutritional Needs:   Kcal:  BEE: 1500kcals, TEE: (IF 1.1-1.3)(AF 1.2) 1980-2340kcals  Protein:  63-78g protein (0.8-1.0g/kg)  Fluid:  1963-2348mL of fluid (25-67mL/kg)  EDUCATION NEEDS:   Education needs no appropriate at this time  Craig. Zenia Resides, [place], [place] (pager)

## 2015-08-20 NOTE — Care Management Important Message (Signed)
Important Message  Patient Details  Name: Aaron Allen MRN: [id number] Date of Birth: 09/09/1941   Medicare Important Message Given:  Geralyn Flash notification given    [name] 08/20/2015, 11:29 AM

## 2015-08-20 NOTE — Clinical Social Work Note (Signed)
Clinical Social Work Assessment  Patient Details  Name: Aaron Allen MRN: [id number] Date of Birth: Aug 30, 1941  Date of referral:  08/20/15               Reason for consult:  Facility Placement                Permission sought to share information with:  Family Supports Permission granted to share information::  Yes, Verbal Permission Granted  Name::        Agency::     Relationship::     Contact Information:     Housing/Transportation Living arrangements for the past 2 months:  Single Family Home Source of Information:  Patient (Sibling) Patient Interpreter Needed:  None Criminal Activity/Legal Involvement Pertinent to Current Situation/Hospitalization:  No - Comment as needed Significant Relationships:  Siblings Lives with:  Self Do you feel safe going back to the place where you live?  Yes Need for family participation in patient care:  Yes (Comment)  Care giving concerns:  Patient resides alone.   Social Worker assessment / plan:  CSW informed by PT, Rod Holler, that she recommends SNF for patient due to her stating patient will require 24/7 supervision and that he does not have that. CSW currently awaiting documentation from PT to determine distance patient was able to make as he has private insurance. CSW spoke with patient and his sister, who was in the room with him, and he gave permission for CSW to speak openly in front of his sister. CSW made patient aware of PT recommendation and patient stated that he would make arrangements for his 4 children to rotate staying with him so he would have 24/7 care. Patient wanted the time to speak to his children to coordinate this and patient's sister confirmed that the children more than likely will be able to do this. Patient's sister stated that patient's daughter, Abigail Butts, lives just a couple of houses down from him.   Employment status:  Retired Nurse, adult PT Recommendations:  [place], [place] / Referral to community resources:     Patient/Family's Response to care:  Patient expressed appreciation for CSW assistance.  Patient/Family's Understanding of and Emotional Response to Diagnosis, Current Treatment, and Prognosis:  Patient was sitting up in his chair and had been resting on and off prior to [place] visit. Patient was pleasant and hopeful he will be able to return home at discharge.  Emotional Assessment Appearance:  Appears stated age Attitude/Demeanor/Rapport:   (pleasant and cooperative) Affect (typically observed):  Accepting, Appropriate Orientation:  Oriented to Self, Oriented to Place, Oriented to  Time, Oriented to Situation Alcohol / Substance use:  Not Applicable Psych involvement (Current and /or in the community):  No (Comment)  Discharge Needs  Concerns to be addressed:  Care Coordination Readmission within the last 30 days:  No Current discharge risk:  None Barriers to Discharge:  No Barriers Identified   Shela Leff, LCSW 08/20/2015, 1:57 PM

## 2015-08-20 NOTE — Progress Notes (Signed)
I came to see him 3 times today.  Note dictated this morning.  Between 9:00 a.m. And 1:00 p.m. there was approximately 600 mL of nasogastric drainage. .  Between 1:00 p.m. and 6:00 p.m. the drainage was reduced to approximately 150 mL.  Impression postoperative small-bowel obstruction.  Continue NG suction at present  monitoring the amount of drainage and observing for bowel function.  I advised the patient and family I will be away next week and Dr.Byrnett and Dr Jamal Collin will see him

## 2015-08-20 NOTE — Progress Notes (Signed)
He had laparoscopic right colectomy 7 days ago.  He had a circumferential tumor of the right colon which was partially obstructing.  He has multiple liver metastasis.    Yesterday he vomited.  X-ray was consistent with small-bowel obstruction.  Nasogastric tube was inserted draining 2 L of brown liquid.    He has had recent palpitations and has had internal medicine and cardiology consultation.    He is diabetic and on sliding scale insulin.   I discontinued his p.m. Lantus  when he was placed on NG suction  He has also been anemic.  He did have 2 units of blood 2 days before surgery.  Hemoglobin this morning is 9.1.  He has had elevated white blood count even before surgery.  Latest white blood count  19,200, today's creatinine 1.26  On examination the NG drainage in the  currentcanister is at 550 mL at 9:00 a.m..  Vital signs are stable.  He is awake alert and oriented.  Abdomen is soft flat and nontender.  His incisions are healing satisfactorily.  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 Have requested   a  walker for him to walk in the hallway.

## 2015-08-21 LAB — BASIC METABOLIC PANEL
Anion gap: 6 (ref 5–15)
BUN: 18 mg/dL (ref 6–20)
CHLORIDE: 104 mmol/L (ref 101–111)
CO2: 25 mmol/L (ref 22–32)
CREATININE: 1.13 mg/dL (ref 0.61–1.24)
Calcium: 8.6 mg/dL — ABNORMAL LOW (ref 8.9–10.3)
GFR calc non Af Amer: >60 mL/min (ref >60)
Glucose, Bld: 210 mg/dL — ABNORMAL HIGH (ref 65–99)
POTASSIUM: 3.5 mmol/L (ref 3.5–5.1)
SODIUM: 135 mmol/L (ref 135–145)

## 2015-08-21 LAB — GLUCOSE, CAPILLARY
Glucose-Capillary: 203 mg/dL — ABNORMAL HIGH (ref 65–99)
Glucose-Capillary: 190 mg/dL — ABNORMAL HIGH (ref 65–99)
Glucose-Capillary: 173 mg/dL — ABNORMAL HIGH (ref 65–99)
Glucose-Capillary: 135 mg/dL — ABNORMAL HIGH (ref 65–99)

## 2015-08-21 NOTE — Progress Notes (Signed)
Afebrile, vital signs stable. NG drainage down over the last shift.  No complaint of pain. Thirsty. Denies passing flatus.  Chest exam: Clear. In spread exit 800.  Cardiac exam: Regular rhythm.  Abdomen, scaphoid, soft, nontender.  Wounds: Clean and dry.  Calves: Soft.  Impression: Post operative partial small bowel obstruction, apparent resolution with decreasing NG drainage.  Plan: Encourage ambulation. Follow-up plain films.

## 2015-08-22 ENCOUNTER — Inpatient Hospital Stay: Payer: Medicare PPO

## 2015-08-22 LAB — GLUCOSE, CAPILLARY
GLUCOSE-CAPILLARY: 184 mg/dL — AB (ref 65–99)
GLUCOSE-CAPILLARY: 138 mg/dL — AB (ref 65–99)
Glucose-Capillary: 173 mg/dL — ABNORMAL HIGH (ref 65–99)
Glucose-Capillary: 144 mg/dL — ABNORMAL HIGH (ref 65–99)

## 2015-08-22 MED ORDER — KCL IN DEXTROSE-NACL 30-5-0.45 MEQ/L-%-% IV SOLN
INTRAVENOUS | Status: DC
  Administered 2015-08-22 – 2015-08-24 (×5): via INTRAVENOUS
  Filled 2015-08-22 (×7): qty 1000

## 2015-08-22 NOTE — Progress Notes (Signed)
Notified Dr Bary Castilla that when pt stood up from chair to ambulate his NG tube came out. May leave out for now.

## 2015-08-22 NOTE — Progress Notes (Addendum)
AVSS (Tmax 99). BM x 2 yesterday and this AM. Thirsty. Lungs: Clear. Cardio: RR. ABD: Soft, non-tender. BS+. Wounds: Clean. Calves: Soft.  Yesterday Met B notable for BS: 210. K+ : 3.5.    Plain films reviewed. Decreased small bowel distension. Paucity of colonic gas.Pattern remains consistent w/ SBO. Will continue IV fluids, K+ supplementation.  Ambulation encouraged w/ patient and nursing.

## 2015-08-23 ENCOUNTER — Inpatient Hospital Stay: Payer: Medicare PPO

## 2015-08-23 LAB — CREATININE, SERUM
CREATININE: 0.96 mg/dL (ref 0.61–1.24)
GFR calc non Af Amer: >60 mL/min (ref >60)

## 2015-08-23 LAB — GLUCOSE, CAPILLARY
GLUCOSE-CAPILLARY: 178 mg/dL — AB (ref 65–99)
Glucose-Capillary: 196 mg/dL — ABNORMAL HIGH (ref 65–99)
Glucose-Capillary: 158 mg/dL — ABNORMAL HIGH (ref 65–99)
Glucose-Capillary: 143 mg/dL — ABNORMAL HIGH (ref 65–99)

## 2015-08-23 LAB — CBC
HCT: 29.1 % — ABNORMAL LOW (ref 40.0–52.0)
Hemoglobin: 8.9 g/dL — ABNORMAL LOW (ref 13.0–18.0)
MCH: 20.9 pg — ABNORMAL LOW (ref 26.0–34.0)
MCHC: 30.7 g/dL — ABNORMAL LOW (ref 32.0–36.0)
MCV: 68.2 fL — ABNORMAL LOW (ref 80.0–100.0)
PLATELETS: 591 10*3/uL — AB (ref 150–440)
RBC: 4.27 MIL/uL — AB (ref 4.40–5.90)
RDW: 29.1 % — ABNORMAL HIGH (ref 11.5–14.5)
WBC: 17.7 10*3/uL — AB (ref 3.8–10.6)

## 2015-08-23 LAB — SURGICAL PATHOLOGY

## 2015-08-23 NOTE — Progress Notes (Signed)
MAXIMUM TEMPERATURE, 99, vital signs stable. Good urine output. NG tube dislodged and removed last night. Denies nausea. Thursday. Passing flatus.  Chest: Clear.  Cardiac: Fairly regular rhythm, telemetry does show occasional premature beats.  Abdomen: Minimal distention, tympanitic, soft, nontender.  Wounds: Clean.  Extremities: Soft.  We'll arrange for additional plain films of the abdomen today. NG drainage was falling prior to tube dislodgment, and small bowel obstruction may be slowly resolving based on decreased small bowel diameter from 64 cm between September 15 10 September 17.

## 2015-08-23 NOTE — Care Management Important Message (Signed)
Important Message  Patient Details  Name: Aaron Allen MRN: [id number] Date of Birth: [birth year]   Medicare Important Message Given:  Yes-second notification given    [name] 08/23/2015, 2:07 PM

## 2015-08-24 ENCOUNTER — Encounter: Payer: Self-pay | Admitting: General Surgery

## 2015-08-24 ENCOUNTER — Encounter: Payer: Self-pay | Admitting: Oncology

## 2015-08-24 ENCOUNTER — Inpatient Hospital Stay: Payer: Medicare PPO | Admitting: Anesthesiology

## 2015-08-24 ENCOUNTER — Inpatient Hospital Stay: Payer: Medicare PPO

## 2015-08-24 ENCOUNTER — Encounter
Admission: RE | Disposition: A | Discharge status: Home Health | Payer: Self-pay | Source: Ambulatory Visit | Attending: Surgery

## 2015-08-24 DIAGNOSIS — K913 Postprocedural intestinal obstruction: Secondary | ICD-10-CM

## 2015-08-24 HISTORY — PX: LAPAROTOMY: SHX154

## 2015-08-24 LAB — GLUCOSE, CAPILLARY
Glucose-Capillary: 236 mg/dL — ABNORMAL HIGH (ref 65–99)
Glucose-Capillary: 147 mg/dL — ABNORMAL HIGH (ref 65–99)
Glucose-Capillary: 138 mg/dL — ABNORMAL HIGH (ref 65–99)
Glucose-Capillary: 140 mg/dL — ABNORMAL HIGH (ref 65–99)

## 2015-08-24 SURGERY — LAPAROTOMY, EXPLORATORY
Anesthesia: General | Wound class: Clean Contaminated

## 2015-08-24 MED ORDER — FENTANYL CITRATE (PF) 100 MCG/2ML IJ SOLN
INTRAMUSCULAR | Status: DC | PRN
  Administered 2015-08-24 (×2): 50 ug via INTRAVENOUS

## 2015-08-24 MED ORDER — ONDANSETRON HCL 4 MG/2ML IJ SOLN: 4.0000 mg | Freq: Once | INTRAMUSCULAR | Status: DC | PRN

## 2015-08-24 MED ORDER — ONDANSETRON HCL 4 MG/2ML IJ SOLN
INTRAMUSCULAR | Status: DC | PRN
  Administered 2015-08-24: 4 mg via INTRAVENOUS

## 2015-08-24 MED ORDER — SODIUM CHLORIDE 0.9 % IV SOLN
1.0000 g | Freq: Once | INTRAVENOUS | Status: AC
  Administered 2015-08-24: 1 g via INTRAVENOUS
  Filled 2015-08-24: qty 1

## 2015-08-24 MED ORDER — LIDOCAINE HCL (CARDIAC) 20 MG/ML IV SOLN
INTRAVENOUS | Status: DC | PRN
  Administered 2015-08-24: 60 mg via INTRAVENOUS

## 2015-08-24 MED ORDER — PROPOFOL 10 MG/ML IV BOLUS
INTRAVENOUS | Status: DC | PRN
  Administered 2015-08-24: 90 mg via INTRAVENOUS

## 2015-08-24 MED ORDER — LACTATED RINGERS IV SOLN
INTRAVENOUS | Status: DC
  Administered 2015-08-24 – 2015-08-27 (×7): via INTRAVENOUS

## 2015-08-24 MED ORDER — ROCURONIUM BROMIDE 100 MG/10ML IV SOLN
INTRAVENOUS | Status: DC | PRN
  Administered 2015-08-24: 30 mg via INTRAVENOUS
  Administered 2015-08-24: 20 mg via INTRAVENOUS

## 2015-08-24 MED ORDER — SUCCINYLCHOLINE CHLORIDE 20 MG/ML IJ SOLN
INTRAMUSCULAR | Status: DC | PRN
  Administered 2015-08-24: 70 mg via INTRAVENOUS

## 2015-08-24 MED ORDER — SODIUM CHLORIDE 0.9 % IV SOLN
INTRAVENOUS | Status: DC | PRN
  Administered 2015-08-24 (×2): via INTRAVENOUS

## 2015-08-24 MED ORDER — FENTANYL CITRATE (PF) 100 MCG/2ML IJ SOLN
25.0000 ug | INTRAMUSCULAR | Status: DC | PRN
  Administered 2015-08-24 (×3): 25 ug via INTRAVENOUS

## 2015-08-24 MED ORDER — MIDAZOLAM HCL 2 MG/2ML IJ SOLN
INTRAMUSCULAR | Status: DC | PRN
  Administered 2015-08-24: 1 mg via INTRAVENOUS

## 2015-08-24 MED ORDER — GLYCOPYRROLATE 0.2 MG/ML IJ SOLN
INTRAMUSCULAR | Status: DC | PRN
  Administered 2015-08-24: .8 mg via INTRAVENOUS

## 2015-08-24 MED ORDER — PHENYLEPHRINE HCL 10 MG/ML IJ SOLN
INTRAMUSCULAR | Status: DC | PRN
  Administered 2015-08-24 (×2): 100 ug via INTRAVENOUS

## 2015-08-24 MED ORDER — ENOXAPARIN SODIUM 40 MG/0.4ML ~~LOC~~ SOLN
40.0000 mg | SUBCUTANEOUS | Status: DC
  Administered 2015-08-25 – 2015-08-29 (×5): 40 mg via SUBCUTANEOUS
  Filled 2015-08-24 (×5): qty 0.4

## 2015-08-24 MED ORDER — NEOSTIGMINE METHYLSULFATE 10 MG/10ML IV SOLN
INTRAVENOUS | Status: DC | PRN
  Administered 2015-08-24: 4 mg via INTRAVENOUS

## 2015-08-24 SURGICAL SUPPLY — 31 items
CANISTER SUCT 1200ML W/VALVE (MISCELLANEOUS) ×3 IMPLANT
CATH TRAY 16F METER LATEX (MISCELLANEOUS) ×3 IMPLANT
CHLORAPREP W/TINT 26ML (MISCELLANEOUS) ×3 IMPLANT
DRAPE LAPAROTOMY 100X77 ABD (DRAPES) ×3 IMPLANT
DRSG OPSITE POSTOP 4X8 (GAUZE/BANDAGES/DRESSINGS) ×6 IMPLANT
DRSG TEGADERM 6X8 (GAUZE/BANDAGES/DRESSINGS) IMPLANT
GLOVE BIO SURGEON STRL SZ7 (GLOVE) ×21 IMPLANT
GOWN STRL REUS W/ TWL LRG LVL3 (GOWN DISPOSABLE) ×4 IMPLANT
GOWN STRL REUS W/TWL LRG LVL3 (GOWN DISPOSABLE) ×8
KIT RM TURNOVER STRD PROC AR (KITS) ×3 IMPLANT
LABEL OR SOLS (LABEL) ×3 IMPLANT
NS IRRIG 1000ML POUR BTL (IV SOLUTION) ×3 IMPLANT
PACK BASIN MAJOR ARMC (MISCELLANEOUS) ×3 IMPLANT
PACK COLON CLEAN CLOSURE (MISCELLANEOUS) IMPLANT
PAD GROUND ADULT SPLIT (MISCELLANEOUS) ×3 IMPLANT
SET YANKAUER POOLE SUCT (MISCELLANEOUS) IMPLANT
SPONGE LAP 18X18 5 PK (GAUZE/BANDAGES/DRESSINGS) IMPLANT
STAPLER SKIN PROX 35W (STAPLE) ×3 IMPLANT
SUT MAXON ABS #0 GS21 30IN (SUTURE) ×6 IMPLANT
SUT PROLENE 0 CT 1 30 (SUTURE) IMPLANT
SUT SILK 2 0 (SUTURE)
SUT SILK 2-0 30XBRD TIE 12 (SUTURE) IMPLANT
SUT SILK 3-0 (SUTURE) ×3 IMPLANT
SUT VIC AB 2-0 BRD 54 (SUTURE) IMPLANT
SUT VIC AB 2-0 CT1 27 (SUTURE)
SUT VIC AB 2-0 CT1 TAPERPNT 27 (SUTURE) IMPLANT
SUT VIC AB 3-0 54X BRD REEL (SUTURE) ×1 IMPLANT
SUT VIC AB 3-0 BRD 54 (SUTURE) ×2
SUT VIC AB 3-0 SH 27 (SUTURE) ×2
SUT VIC AB 3-0 SH 27X BRD (SUTURE) ×1 IMPLANT
SYR BULB IRRIG 60ML STRL (SYRINGE) ×3 IMPLANT

## 2015-08-24 NOTE — Op Note (Signed)
Preop diagnosis: Small bowel obstruction and postop right hemicolectomy  Post op diagnosis: Small bowel obstruction due to the previous adhesions and distal small bowel  Operation: Laparotomy lysis of adhesion and enterotomy were decompression  Surgeon: S.G.Sankar  Assistant:     Anesthesia: Gen.  Complications: None  EBL: Minimal  Drains: None  Description: Patient was put to sleep in the supine position the operating table. Foley catheter was inserted and removed at the end of the procedure. The previous skin stitches of the midline incision above the umbilicus were removed. The abdomen was prepped and draped as sterile field and timeout performed. The previous skin incision was reopened and the abdominal cavity was entered into. There was evidence of S significantly dilated bowel and the anastomosis of the terminal ileum to the right colon was easily identified. This anastomotic side was perfectly intact and free of any obstruction. The ileum was followed all the way proximal and showed a couple of areas of fibrinous adhesion deep in the right lower quadrant area region and these were easily freed and followed up until there was evidence of obstructive process. Half of the ended in view of this small enterotomy was made and the lumen decompressed-moderate amount of barium and air removed. The enterotomy was closed with 3-0 Vicryl pursestring stitch followed by seromuscular 3-0 silk. After this the small bowel was completely inspected with no other area areas of concern the NG tube was positioned into the stomach the bowel was placed back in his original position and covered with a little bit of the omentum that was left. The fascia and peritoneum closed in single layer with interrupted figure-of-eight stitches of 0 Maxon. The skin was approximated with skin staples. A honeycomb dressing was placed. Patient was then reversed extubated and returned recovery room stable condition.

## 2015-08-24 NOTE — Anesthesia Postprocedure Evaluation (Signed)
  Anesthesia Post-op Note  Patient: Aaron Allen  Procedure(s) Performed: Procedure(s): EXPLORATORY LAPAROTOMY, ENTEROLYSIS OF ADHESIONS, ENTEROTOMY FOR DECOMPRESSION (N/A)  Anesthesia type:General  Patient location: PACU  Post pain: Pain level controlled  Post assessment: Post-op Vital signs reviewed, Patient's Cardiovascular Status Stable, Respiratory Function Stable, Patent Airway and No signs of Nausea or vomiting  Post vital signs: Reviewed and stable  Last Vitals:  Filed Vitals:   08/24/15 1435  BP: 104/61  Pulse: 90  Temp: 36.9 C  Resp: 20    Level of consciousness: awake, alert  and patient cooperative  Complications: No apparent anesthesia complications

## 2015-08-24 NOTE — Progress Notes (Signed)
Nutrition Follow-up       INTERVENTION:  Meals and snacks: await diet progression following return of bowel function. If unable to progress diet in next 1-2 days and aggressive nutrition therapy is wanted, may benefit from nutrition support  Coordination of care: Will ask nursing for new wt   NUTRITION DIAGNOSIS:   Inadequate oral intake related to acute illness as evidenced by  (NPO diet order, just advanced to CL).    GOAL:   Patient will meet greater than or equal to 90% of their needs  Not meeting nutritional goals  MONITOR:    (Energy Intake, Glucose Profile, Anthropometrics, Digestive System)  REASON FOR ASSESSMENT:   Malnutrition Screening Tool    ASSESSMENT:   Pt admitted for lap right colectomy 08/13/2015 secondary to colon cancer found during colonoscopy. Per MD note, CT also consistent with hepatic metasis.   Pt s/p laporatomy today with lysis of adhesions and enterotomy   Current Nutrition: NPO, limited nutrition day 11 of admission (NPO/CL and full liquid diet)   Gastrointestinal Profile: noted abdomen soft, hypoactive bowel sounds Last BM: 9/20   Medications: reviewed  Electrolyte/Renal Profile and Glucose Profile:   Recent Labs Lab 08/17/15 2015  08/19/15 0420 08/20/15 0610 08/21/15 0550 08/23/15 1506  NA 129*  < > 132* 134* 135  --   K 4.9  < > 4.5 4.0 3.5  --   CL 92*  < > 98* 102 104  --   CO2 27  < > 23 23 25   --   BUN 24*  < > 27* 25* 18  --   CREATININE 1.39*  < > 1.30* 1.26* 1.13 0.96  CALCIUM 9.1  < > 9.2 8.8* 8.6*  --   MG 1.8  --  2.1  --   --   --   GLUCOSE 158*  < > 113* 128* 210*  --   < > = values in this interval not displayed.     Weight Trend since Admission: Filed Weights   08/13/15 1023  Weight: 173 lb (78.472 kg)      Diet Order:  Diet NPO time specified Except for: Other (See Comments)  Skin:   reviewed   Height:   Ht Readings from Last 1 Encounters:  08/13/15 5\' 9"  (1.753 m)    Weight:   Wt  Readings from Last 1 Encounters:  08/13/15 173 lb (78.472 kg)    Ideal Body Weight:     BMI:  Body mass index is 25.54 kg/(m^2).  Estimated Nutritional Needs:   Kcal:  BEE: 1500kcals, TEE: (IF 1.1-1.3)(AF 1.2) 1980-2340kcals  Protein:  63-78g protein (0.8-1.0g/kg)  Fluid:  1963-2383mL of fluid (25-51mL/kg)  EDUCATION NEEDS:   Education needs no appropriate at this time  [place]. Zenia Resides, [place], [place] (pager)

## 2015-08-24 NOTE — Progress Notes (Signed)
PT Cancellation Note  Patient Details Name: Aaron Allen MRN: [id number] DOB: 1941/07/17   Cancelled Treatment:    Reason Eval/Treat Not Completed: Other (comment). Pt had laparoscopy and R hemicolectomy this date. Pt not available for therapy. Will need new orders to resume PT treatment. Will dc current order.   Maurilio Puryear 08/24/2015, 3:08 PM Greggory Stallion, PT, DPT [phone]

## 2015-08-24 NOTE — Anesthesia Preprocedure Evaluation (Addendum)
Anesthesia Evaluation  Patient identified by MRN, date of birth, ID band Patient awake    Reviewed: Allergy & Precautions, NPO status , Patient's Chart, lab work & pertinent test results  History of Anesthesia Complications Negative for: history of anesthetic complications  Airway Mallampati: III  TM Distance: >3 FB Neck ROM: Full    Dental  (+) Upper Dentures, Lower Dentures   Pulmonary former smoker (quit x 50 yrs),           Cardiovascular hypertension, Pt. on medications + Valvular Problems/Murmurs (hx of murmur)      Neuro/Psych    GI/Hepatic   Endo/Other  diabetes, Type 2, Oral Hypoglycemic Agents  Renal/GU Renal InsufficiencyRenal disease     Musculoskeletal   Abdominal   Peds  Hematology  (+) anemia ,   Anesthesia Other Findings   Reproductive/Obstetrics                            Anesthesia Physical Anesthesia Plan  ASA: III  Anesthesia Plan: General   Post-op Pain Management:    Induction: Intravenous and Rapid sequence  Airway Management Planned: Oral ETT  Additional Equipment:   Intra-op Plan:   Post-operative Plan:   Informed Consent: I have reviewed the patients History and Physical, chart, labs and discussed the procedure including the risks, benefits and alternatives for the proposed anesthesia with the patient or authorized representative who has indicated his/her understanding and acceptance.     Plan Discussed with:   Anesthesia Plan Comments:         Anesthesia Quick Evaluation

## 2015-08-24 NOTE — Progress Notes (Signed)
Afebrile, vital signs stable. Patient reports 2 bowel movements over the last 24 hours.  Denies any pain. Hungry.  Abdomen: Tympanitic, soft, nontender.  Small bowel follow-through images from initiation to study until 3 AM images reviewed. No progress between 10 PM and 3 AM. Modest proximal small bowel dilatation. Small amount of distal small bowel gas. No left colonic gas. 8 AM films pending.  Reviewed with the patient and his daughter the lack of progress over the last 4 days. Likely adhesive band. Options: Continued supportive care unlikely to resolve the situation versus operative exploration.  Dr. Jamal Collin to review and consider if the patient is a candidate for surgical intervention.

## 2015-08-24 NOTE — Progress Notes (Signed)
Patient ID: Aaron Allen, male   DOB: [birth year], 74 y.o.   MRN: [id number] Patient is now 11 days postop laparoscopy right hemicolectomy for cancer lesion. Today he has failed to have any significant improvement in what was suspected to be an ileus but now appears to be a small bowel obstruction. The patient did have a bowel movement this morning but in spite of that his small bowel series and pictures obtained this morning showed no progression of the contrast beyond the mid small bowel. Feel it is safer to look in the abdominal cavity and correct any obstruction. Patient was advised accordingly and he is agreeable.

## 2015-08-24 NOTE — Anesthesia Procedure Notes (Signed)
Procedure Name: Intubation Date/Time: 08/24/2015 11:24 AM Performed by: Aline Brochure Pre-anesthesia Checklist: Patient identified, Emergency Drugs available, Suction available and Patient being monitored Patient Re-evaluated:Patient Re-evaluated prior to inductionOxygen Delivery Method: Circle system utilized Preoxygenation: Pre-oxygenation with 100% oxygen Intubation Type: Rapid sequence, Cricoid Pressure applied and IV induction Laryngoscope Size: Mac and 4 Grade View: Grade I Tube type: Oral Tube size: 7.5 mm Number of attempts: 1 Airway Equipment and Method: Patient positioned with wedge pillow and Stylet Placement Confirmation: ETT inserted through vocal cords under direct vision,  positive ETCO2 and breath sounds checked- equal and bilateral Secured at: 23 cm Tube secured with: Tape Dental Injury: Teeth and Oropharynx as per pre-operative assessment

## 2015-08-24 NOTE — Transfer of Care (Signed)
Immediate Anesthesia Transfer of Care Note  Patient: Aaron Allen  Procedure(s) Performed: Procedure(s): EXPLORATORY LAPAROTOMY, ENTEROLYSIS OF ADHESIONS, ENTEROTOMY FOR DECOMPRESSION (N/A)  Patient Location: PACU  Anesthesia Type:General  Level of Consciousness: awake  Airway & Oxygen Therapy: Patient Spontanous Breathing and Patient connected to face mask oxygen  Post-op Assessment: Report given to RN and Post -op Vital signs reviewed and stable  Post vital signs: Reviewed and stable  Last Vitals:  Filed Vitals:   08/24/15 1248  BP: 133/73  Pulse: 103  Temp: 36.1 C  Resp: 25    Complications: No apparent anesthesia complications

## 2015-08-25 LAB — GLUCOSE, CAPILLARY
GLUCOSE-CAPILLARY: 143 mg/dL — AB (ref 65–99)
GLUCOSE-CAPILLARY: 123 mg/dL — AB (ref 65–99)
Glucose-Capillary: 141 mg/dL — ABNORMAL HIGH (ref 65–99)
Glucose-Capillary: 142 mg/dL — ABNORMAL HIGH (ref 65–99)

## 2015-08-25 LAB — BASIC METABOLIC PANEL
ANION GAP: 11 (ref 5–15)
BUN: 22 mg/dL — ABNORMAL HIGH (ref 6–20)
CALCIUM: 8.3 mg/dL — AB (ref 8.9–10.3)
CO2: 19 mmol/L — AB (ref 22–32)
CREATININE: 1.62 mg/dL — AB (ref 0.61–1.24)
Chloride: 108 mmol/L (ref 101–111)
GFR calc non Af Amer: 40 mL/min — ABNORMAL LOW (ref >60)
GFR, EST AFRICAN AMERICAN: 46 mL/min — AB (ref >60)
Glucose, Bld: 151 mg/dL — ABNORMAL HIGH (ref 65–99)
Potassium: 4.5 mmol/L (ref 3.5–5.1)
SODIUM: 138 mmol/L (ref 135–145)

## 2015-08-25 LAB — CBC
HEMATOCRIT: 29.5 % — AB (ref 40.0–52.0)
HEMOGLOBIN: 9.1 g/dL — AB (ref 13.0–18.0)
MCH: 22.1 pg — ABNORMAL LOW (ref 26.0–34.0)
MCHC: 31 g/dL — AB (ref 32.0–36.0)
MCV: 71.1 fL — ABNORMAL LOW (ref 80.0–100.0)
Platelets: 552 10*3/uL — ABNORMAL HIGH (ref 150–440)
RBC: 4.15 MIL/uL — ABNORMAL LOW (ref 4.40–5.90)
RDW: 29.6 % — AB (ref 11.5–14.5)
WBC: 18.6 10*3/uL — AB (ref 3.8–10.6)

## 2015-08-25 MED ORDER — LACTATED RINGERS IV BOLUS (SEPSIS)
500.0000 mL | Freq: Once | INTRAVENOUS | Status: AC
  Administered 2015-08-25: 500 mL via INTRAVENOUS

## 2015-08-25 NOTE — Progress Notes (Signed)
Patient ID: Aaron Allen, male   DOB: 10-29-1941, 74 y.o.   MRN: [id number] No complaints. Has been walking. U/O was low overnight , received 568ml bolus. Labs- creat 1.6 up from 0.96 last. WBC 18K, Hgb 9.1 AVSS. Abdomen is softer, hypoactive bowel sounds,. Incision clean. Lungs clear. U/O improving. NGT- bile stained fluid.  Overall stable. Monitor u/o. Encouraged to continue walking.

## 2015-08-25 NOTE — Care Management (Addendum)
Discussed care with Dr Bary Castilla and requested PT re evaluation. Patient ambulating around floor with nurses aid using walker.Telephone order for PT given . Continue to follow.

## 2015-08-25 NOTE — Progress Notes (Signed)
Notified of HR, lack of void x 8 hours and bladder scan < 150 cc. Fluid bolus ordered. VS moved to q4h for the present.

## 2015-08-25 NOTE — Evaluation (Signed)
Physical Therapy RE-Evaluation Patient Details Name: Aaron Allen MRN: [id number] DOB: [birth year] Today's Date: 08/25/2015   History of Present Illness  74 yo male with onset of laparoscopic colectomy for treatment of mass on colon, with leukocytosis and tachycardia, demand ischemia and elevated troponin.   PMHx:  CKD 3, DM, Re-eval performed this date secondary to recent surgery on 9/20 for SBO and R hemicolectomy.  Clinical Impression  Pt is a pleasant 74 year old male who was admitted for treatment of mass on colon. Pt performs transfers and ambulation with cga with rw. Pt reports recently ambulating twice around RN station prior to PT arrival. Pt fatigued from recent exertion, however agreeable to further ambulation this date. 1 standing rest break needed during ambulation trial, however overall general endurance is good. Pt demonstrates deficits with strength, endurance, and pain. Would benefit from skilled PT to address above deficits and promote optimal return to PLOF      Follow Up Recommendations Home health PT    Equipment Recommendations  Rolling walker with 5" wheels    Recommendations for Other Services       Precautions / Restrictions Precautions Precautions: Fall Restrictions Weight Bearing Restrictions: No      Mobility  Bed Mobility               General bed mobility comments: up when PT entered  Transfers Overall transfer level: Needs assistance Equipment used: Rolling walker (2 wheeled);1 person hand held assist Transfers: Sit to/from Stand Sit to Stand: Min guard         General transfer comment: sit<>stand with rw and cga. Safe technique performed  Ambulation/Gait Ambulation/Gait assistance: Min guard Ambulation Distance (Feet): 160 Feet Assistive device: Rolling walker (2 wheeled);1 person hand held assist Gait Pattern/deviations: Step-through pattern     General Gait Details: ambulated in hallway, with one standing rest break secondary  to fatigue. CGA provided with rw. Safe technique with slow speed. Reciprocal gait pattern performed.  Stairs            Wheelchair Mobility    Modified Rankin (Stroke Patients Only)       Balance Overall balance assessment: Needs assistance Sitting-balance support: Bilateral upper extremity supported Sitting balance-Leahy Scale: Good     Standing balance support: Bilateral upper extremity supported Standing balance-Leahy Scale: Fair                               Pertinent Vitals/Pain Pain Assessment: No/denies pain Pain Score: 8  Pain Location: abdomen Pain Descriptors / Indicators: Constant Pain Intervention(s): Limited activity within patient's tolerance    Home Living Family/patient expects to be discharged to:: Private residence Living Arrangements: Alone Available Help at Discharge: Family;Available PRN/intermittently Type of Home: House Home Access: Stairs to enter Entrance Stairs-Rails: Right Entrance Stairs-Number of Steps: 3 Home Layout: Two level;Other (Comment) Home Equipment: None      Prior Function Level of Independence: Independent               Hand Dominance   Dominant Hand: Right    Extremity/Trunk Assessment   Upper Extremity Assessment: Generalized weakness           Lower Extremity Assessment: Generalized weakness (B LE grossly 4/5)         Communication   Communication: No difficulties  Cognition Arousal/Alertness: Awake/alert Behavior During Therapy: WFL for tasks assessed/performed Overall Cognitive Status: Within Functional Limits for tasks assessed  General Comments      Exercises        Assessment/Plan    PT Assessment Patient needs continued PT services  PT Diagnosis Difficulty walking;Generalized weakness;Acute pain   PT Problem List Decreased strength;Decreased activity tolerance;Decreased mobility;Decreased knowledge of use of DME  PT Treatment  Interventions Gait training;DME instruction;Therapeutic exercise   PT Goals (Current goals can be found in the Care Plan section) Acute Rehab PT Goals Patient Stated Goal: to walk with more energy PT Goal Formulation: With patient Time For Goal Achievement: 09/08/15 Potential to Achieve Goals: Good    Frequency Min 2X/week   Barriers to discharge        Co-evaluation               End of Session Equipment Utilized During Treatment: Gait belt Activity Tolerance: Patient limited by fatigue Patient left: in chair;with chair alarm set Nurse Communication: Mobility status         Time: 7001-7494 PT Time Calculation (min) (ACUTE ONLY): 20 min   Charges:   PT Evaluation $PT Re-evaluation: 1 Procedure     PT G Codes:        Ray,Stephanie 09/04/2015, 4:36 PM  Greggory Stallion, PT, DPT [phone]

## 2015-08-25 NOTE — Progress Notes (Signed)
   08/24/15 2352  Urine Characteristics  Bladder Scan Volume (mL) 139 mL  Dr. Bary Castilla notified that pt has not voided since foley was removed at 1230pm yesterday. Bladder scan shown above.  VSS but pulse slightly elevated at 104. MD stated he will place orders. No straight cath at this time

## 2015-08-26 ENCOUNTER — Inpatient Hospital Stay: Payer: Medicare PPO

## 2015-08-26 LAB — GLUCOSE, CAPILLARY
GLUCOSE-CAPILLARY: 143 mg/dL — AB (ref 65–99)
GLUCOSE-CAPILLARY: 146 mg/dL — AB (ref 65–99)
GLUCOSE-CAPILLARY: 124 mg/dL — AB (ref 65–99)
Glucose-Capillary: 141 mg/dL — ABNORMAL HIGH (ref 65–99)
Glucose-Capillary: 139 mg/dL — ABNORMAL HIGH (ref 65–99)
Glucose-Capillary: 136 mg/dL — ABNORMAL HIGH (ref 65–99)

## 2015-08-26 LAB — CBC WITH DIFFERENTIAL/PLATELET
BASOS ABS: 0 10*3/uL (ref 0–0.1)
BASOS PCT: 0 %
Eosinophils Absolute: 0 10*3/uL (ref 0–0.7)
Eosinophils Relative: 0 %
HEMATOCRIT: 28.3 % — AB (ref 40.0–52.0)
HEMOGLOBIN: 8.7 g/dL — AB (ref 13.0–18.0)
Lymphocytes Relative: 2 %
Lymphs Abs: 0.5 10*3/uL — ABNORMAL LOW (ref 1.0–3.6)
MCH: 21.1 pg — ABNORMAL LOW (ref 26.0–34.0)
MCHC: 30.8 g/dL — ABNORMAL LOW (ref 32.0–36.0)
MCV: 68.5 fL — ABNORMAL LOW (ref 80.0–100.0)
MONOS PCT: 9 %
Monocytes Absolute: 1.8 10*3/uL — ABNORMAL HIGH (ref 0.2–1.0)
NEUTROS ABS: 19.3 10*3/uL — AB (ref 1.4–6.5)
NEUTROS PCT: 89 %
Platelets: 580 10*3/uL — ABNORMAL HIGH (ref 150–440)
RBC: 4.14 MIL/uL — ABNORMAL LOW (ref 4.40–5.90)
RDW: 29 % — ABNORMAL HIGH (ref 11.5–14.5)
WBC: 21.7 10*3/uL — ABNORMAL HIGH (ref 3.8–10.6)

## 2015-08-26 NOTE — Progress Notes (Signed)
Patient ID: Aaron Allen, male   DOB: Aug 13, 1941, 74 y.o.   MRN: [id number] No complaints. He is hungry. U/O much improved. NGT drainage decreased. Stiil wiyth mild tachycardia to 110. BP stable. WBC still high at 21K- no apparent cause. It has been high for several days. No fever. Abdomen is soft and flat, more active bowel sounds today./ incision is clean. Lungs with few coarse crackles in left base Overall stable. No apparent cause for his lecocytosis and tachycardia.  Will get a CXR today. D/C NGT. Allow sips po.

## 2015-08-26 NOTE — Clinical Social Work Note (Signed)
Patient progressing well and CSW watched him ambulate around nurse's station with stand by assistance yesterday. PT has assessed patient and stated he has not skillable PT need at this time. Shela Leff MSW,LCSW [phone]

## 2015-08-26 NOTE — Progress Notes (Signed)
Physical Therapy Treatment Patient Details Name: Aaron Allen MRN: [id number] DOB: 1940-12-16 Today's Date: 08/26/2015    History of Present Illness 74 yo male with onset of laparoscopic colectomy for treatment of mass on colon, with leukocytosis and tachycardia, demand ischemia and elevated troponin.   PMHx:  CKD 3, DM, Re-eval performed this date secondary to recent surgery on 9/20 for SBO and R hemicolectomy.    PT Comments    Pt progressing towards goals by demonstrating good functionally strong transfers and stable gait of fair distances. He is motivated to participate in therapy, however he is still limited by constant abdominal pain secondary to his surgery. Pt still gets fatigued with ambulation, and this deficit in his endurance will continue to benefit from skilled PT in order for him to return home safely. Pt is very pleasant to work with.   Follow Up Recommendations  Home health PT     Equipment Recommendations  Rolling walker with 5" wheels    Recommendations for Other Services       Precautions / Restrictions Precautions Precautions: Fall Restrictions Weight Bearing Restrictions: No    Mobility  Bed Mobility Overal bed mobility:  (Up in recliner; not assessed )                Transfers Overall transfer level: Needs assistance Equipment used: Rolling walker (2 wheeled);1 person hand held assist Transfers: Sit to/from Stand Sit to Stand: Supervision         General transfer comment: Pt performs sit-to-stand with good functional strength and confidence. Pt has good hand placement and a steady rise into full standing. No LOB or unsteadiness.  Ambulation/Gait Ambulation/Gait assistance: Min guard Ambulation Distance (Feet): 220 Feet Assistive device: Rolling walker (2 wheeled);1 person hand held assist Gait Pattern/deviations: WFL(Within Functional Limits) Gait velocity: reduced Gait velocity interpretation: Below normal speed for age/gender General  Gait Details: Pt generally ambulates WFL, although requiring RW in hospital for unsteadiness/fatigue when he usually does not use one. Pt requires two 20-30 second breaks to navigate around the nursing station.    Stairs            Wheelchair Mobility    Modified Rankin (Stroke Patients Only)       Balance Overall balance assessment: Needs assistance Sitting-balance support: Bilateral upper extremity supported Sitting balance-Leahy Scale: Good     Standing balance support: Bilateral upper extremity supported Standing balance-Leahy Scale: Good                      Cognition Arousal/Alertness: Awake/alert Behavior During Therapy: WFL for tasks assessed/performed Overall Cognitive Status: Within Functional Limits for tasks assessed                      Exercises      General Comments        Pertinent Vitals/Pain Pain Assessment: 0-10 Pain Score: 6  Pain Location: abdomen Pain Descriptors / Indicators: Constant Pain Intervention(s): Limited activity within patient's tolerance;Monitored during session (Ambulation was all he stated he could tolerate)    Home Living                      Prior Function            PT Goals (current goals can now be found in the care plan section) Acute Rehab PT Goals Patient Stated Goal: to walk a bit PT Goal Formulation: With patient Time For Goal Achievement: 09/08/15 Potential  to Achieve Goals: Good Progress towards PT goals: Progressing toward goals    Frequency  Min 2X/week    PT Plan Current plan remains appropriate    Co-evaluation             End of Session Equipment Utilized During Treatment: Gait belt Activity Tolerance: Patient limited by fatigue Patient left: in chair;with chair alarm set;with call bell/phone within reach     Time: 2119-4174 PT Time Calculation (min) (ACUTE ONLY): 11 min  Charges:                       G CodesJanyth Contes Sep 11, 2015, 4:13  PM Janyth Contes, SPT. [phone]

## 2015-08-27 LAB — CBC WITH DIFFERENTIAL/PLATELET
BASOS ABS: 0 10*3/uL (ref 0–0.1)
BASOS PCT: 0 %
EOS PCT: 1 %
Eosinophils Absolute: 0.1 10*3/uL (ref 0–0.7)
HEMATOCRIT: 28.1 % — AB (ref 40.0–52.0)
Hemoglobin: 8.9 g/dL — ABNORMAL LOW (ref 13.0–18.0)
Lymphocytes Relative: 3 %
Lymphs Abs: 0.6 10*3/uL — ABNORMAL LOW (ref 1.0–3.6)
MCH: 21.8 pg — ABNORMAL LOW (ref 26.0–34.0)
MCHC: 31.8 g/dL — AB (ref 32.0–36.0)
MCV: 68.6 fL — AB (ref 80.0–100.0)
MONO ABS: 1.4 10*3/uL — AB (ref 0.2–1.0)
MONOS PCT: 6 %
Neutro Abs: 20.1 10*3/uL — ABNORMAL HIGH (ref 1.4–6.5)
Neutrophils Relative %: 90 %
PLATELETS: 535 10*3/uL — AB (ref 150–440)
RBC: 4.1 MIL/uL — ABNORMAL LOW (ref 4.40–5.90)
RDW: 29.8 % — AB (ref 11.5–14.5)
WBC: 22.3 10*3/uL — ABNORMAL HIGH (ref 3.8–10.6)

## 2015-08-27 LAB — BASIC METABOLIC PANEL
Anion gap: 8 (ref 5–15)
BUN: 20 mg/dL (ref 6–20)
CALCIUM: 8.7 mg/dL — AB (ref 8.9–10.3)
CO2: 23 mmol/L (ref 22–32)
Chloride: 109 mmol/L (ref 101–111)
Creatinine, Ser: 0.89 mg/dL (ref 0.61–1.24)
GFR calc Af Amer: >60 mL/min (ref >60)
GLUCOSE: 144 mg/dL — AB (ref 65–99)
Potassium: 4 mmol/L (ref 3.5–5.1)
Sodium: 140 mmol/L (ref 135–145)

## 2015-08-27 LAB — GLUCOSE, CAPILLARY
GLUCOSE-CAPILLARY: 144 mg/dL — AB (ref 65–99)
GLUCOSE-CAPILLARY: 133 mg/dL — AB (ref 65–99)
Glucose-Capillary: 136 mg/dL — ABNORMAL HIGH (ref 65–99)
Glucose-Capillary: 131 mg/dL — ABNORMAL HIGH (ref 65–99)

## 2015-08-27 MED ORDER — OXYCODONE-ACETAMINOPHEN 5-325 MG PO TABS
1.0000 | ORAL_TABLET | ORAL | Status: DC | PRN
  Administered 2015-08-28: 1 via ORAL
  Filled 2015-08-27: qty 1

## 2015-08-27 NOTE — Progress Notes (Signed)
Patient ID: Aaron Allen, male   DOB: May 08, 1941, 74 y.o.   MRN: [id number] No new problems. AVSS. Heart rate trending down. Leucocytosis persist-suspect this may be from his  his metastatic disease. Abdomen is soft, flat, good bowel sounds. Lungs- left basal crackles have resolved. Doing well.  Start clear liq diet

## 2015-08-27 NOTE — Progress Notes (Signed)
Physical Therapy Treatment Patient Details Name: Aaron Allen MRN: [id number] DOB: June 04, 1941 Today's Date: 08/27/2015    History of Present Illness 74 yo male with onset of laparoscopic colectomy for treatment of mass on colon, with leukocytosis and tachycardia, demand ischemia and elevated troponin.   PMHx:  CKD 3, DM, Re-eval performed early this week secondary to recent surgery on 9/20 for SBO and R hemicolectomy.    PT Comments    Pt able to practice stairs and ambulate greater distance today. Pt less limited by pain and motivated to participate in therapy. Pt remains very pleasant to work with. Pt still fatigues with ambulation and stair training, but shows good functional strength with transfers and bed mobility. Because pt no longer has skilled PT need, he will be D/C'd from therapy services. Nursing staff notified to continue to allow him to perform ambulation while here. Should further skilled PT needs arise, please issue new orders.   Follow Up Recommendations  Home health PT     Equipment Recommendations  Rolling walker with 5" wheels    Recommendations for Other Services       Precautions / Restrictions Precautions Precautions: Fall Restrictions Weight Bearing Restrictions: No    Mobility  Bed Mobility               General bed mobility comments: up when PT entered  Transfers Overall transfer level: Modified independent Equipment used: Rolling walker (2 wheeled);1 person hand held assist Transfers: Sit to/from Stand Sit to Stand: Modified independent (Device/Increase time)         General transfer comment: Pt with good functional strength and balance with transfers. Needs no assistance   Ambulation/Gait Ambulation/Gait assistance: Min guard Ambulation Distance (Feet): 330 Feet Assistive device: Rolling walker (2 wheeled);1 person hand held assist Gait Pattern/deviations: WFL(Within Functional Limits) Gait velocity: reduced Gait velocity  interpretation: Below normal speed for age/gender General Gait Details: Pt generally ambulates WFL, although requiring RW in hospital for unsteadiness/fatigue when he usually does not use one. Pt requires two 20-30 second breaks to navigate around the nursing station.  (Slightly increased distance with stairs in the middle)   Stairs Stairs: Yes Stairs assistance: Min guard Stair Management: Two rails Number of Stairs: 4 General stair comments: Pt navigates stairs with good confidence and speed. No unsteadiness or LOB  Wheelchair Mobility    Modified Rankin (Stroke Patients Only)       Balance Overall balance assessment: Needs assistance Sitting-balance support: Bilateral upper extremity supported Sitting balance-Leahy Scale: Good     Standing balance support: Bilateral upper extremity supported Standing balance-Leahy Scale: Good                      Cognition Arousal/Alertness: Awake/alert Behavior During Therapy: WFL for tasks assessed/performed Overall Cognitive Status: Within Functional Limits for tasks assessed                      Exercises      General Comments        Pertinent Vitals/Pain Pain Assessment: 0-10 Pain Score: 3  Pain Location: abdomen Pain Descriptors / Indicators: Constant Pain Intervention(s): Limited activity within patient's tolerance;Monitored during session    Home Living                      Prior Function            PT Goals (current goals can now be found in the care plan  section) Acute Rehab PT Goals Patient Stated Goal: to go practice stairs PT Goal Formulation: With patient Time For Goal Achievement: 09/08/15 Potential to Achieve Goals: Good Progress towards PT goals: Progressing toward goals    Frequency  Min 2X/week    PT Plan Current plan remains appropriate    Co-evaluation             End of Session Equipment Utilized During Treatment: Gait belt Activity Tolerance: Patient  limited by fatigue Patient left: in bed;with bed alarm set;with family/visitor present;with call bell/phone within reach     Time: 1015-1040 PT Time Calculation (min) (ACUTE ONLY): 25 min  Charges:  $Gait Training: 23-37 mins                    G CodesJanyth Contes Sep 05, 2015, 5:14 PM  Janyth Contes, SPT. [phone]

## 2015-08-27 NOTE — Progress Notes (Signed)
Initial Nutrition Assessment   INTERVENTION:   Meals and Snacks: Cater to patient preferences, follow diet progression Medical Food Supplement Therapy: recommend addition of nutritional supplement if pt tolerating po diet  NUTRITION DIAGNOSIS:   Inadequate oral intake related to acute illness as evidenced by  (NPO diet order, just advanced to CL).  GOAL:   Patient will meet greater than or equal to 90% of their needs  MONITOR:    (Energy Intake, Glucose Profile, Anthropometrics, Digestive System)  REASON FOR ASSESSMENT:   Malnutrition Screening Tool    ASSESSMENT:   Pt admitted for lap right colectomy 08/13/2015 secondary to colon cancer found during colonoscopy. Per MD note, CT also consistent with hepatic metasis.    Pt s/p lap lysis of adhesions and enterotomy on 9/20  Diet Order:  Diet clear liquid Room service appropriate?: Yes; Fluid consistency:: Thin   Energy Intake: tolerated sips of CL, no N/V  Last BM:  9/21  Height:   Ht Readings from Last 1 Encounters:  08/13/15 5\' 9"  (1.753 m)    Weight:   Wt Readings from Last 1 Encounters:  08/25/15 165 lb 12.8 oz (75.206 kg)   Meds: LR at 75 ml/hr, ss novolog  BMI:  Body mass index is 24.47 kg/(m^2).  Estimated Nutritional Needs:   Kcal:  BEE: 1500kcals, TEE: (IF 1.1-1.3)(AF 1.2) 1980-2340kcals  Protein:  63-78g protein (0.8-1.0g/kg)  Fluid:  1963-2330mL of fluid (25-52mL/kg)  EDUCATION NEEDS:   Education needs no appropriate at this time  [place], [place], LDN [phone] Pager

## 2015-08-27 NOTE — Care Management (Signed)
From home with son, States drives self. Has a Rolator walker at home, Stated that he prefers to use a cane. Ambulated 330 feet with PT today yet still recommended for Home Health. Gave patient choice of providers. Patient had no preference. Refferal placed with Floydene Flock at [facility].  Patient on clear liquids this afternoon anticipated discharge soon.

## 2015-08-27 NOTE — Care Management Important Message (Signed)
Important Message  Patient Details  Name: Aaron Allen MRN: [id number] Date of Birth: 1941/09/12   Medicare Important Message Given:  Yes-fourth notification given    [name] 08/27/2015, 9:42 AM

## 2015-08-27 NOTE — Progress Notes (Signed)
Physical Therapy Treatment Patient Details Name: BLUFORD SEDLER MRN: [id number] DOB: [birth year] Today's Date: 08/27/2015    History of Present Illness 74 yo male with onset of laparoscopic colectomy for treatment of mass on colon, with leukocytosis and tachycardia, demand ischemia and elevated troponin.   PMHx:  CKD 3, DM, Re-eval performed early this week secondary to recent surgery on 9/20 for SBO and R hemicolectomy.    PT Comments    Pt able to practice stairs and ambulate greater distance today. Pt less limited by pain and motivated to participate in therapy. Pt remains very pleasant to work with. Pt still fatigues with ambulation and stair training, but shows good functional strength with transfers and bed mobility. He will continue to benefit from skilled PT in order to address his activity tolerance deficits.   Follow Up Recommendations  Home health PT     Equipment Recommendations  Rolling walker with 5" wheels    Recommendations for Other Services       Precautions / Restrictions Precautions Precautions: Fall Restrictions Weight Bearing Restrictions: No    Mobility  Bed Mobility               General bed mobility comments: up when PT entered  Transfers Overall transfer level: Modified independent Equipment used: Rolling walker (2 wheeled);1 person hand held assist Transfers: Sit to/from Stand Sit to Stand: Modified independent (Device/Increase time)         General transfer comment: Pt with good functional strength and balance with transfers. Needs no assistance   Ambulation/Gait Ambulation/Gait assistance: Min guard Ambulation Distance (Feet): 330 Feet Assistive device: Rolling walker (2 wheeled);1 person hand held assist Gait Pattern/deviations: WFL(Within Functional Limits) Gait velocity: reduced Gait velocity interpretation: Below normal speed for age/gender General Gait Details: Pt generally ambulates WFL, although requiring RW in hospital for  unsteadiness/fatigue when he usually does not use one. Pt requires two 20-30 second breaks to navigate around the nursing station.  (Slightly increased distance with stairs in the middle)   Stairs Stairs: Yes Stairs assistance: Min guard Stair Management: Two rails Number of Stairs: 4 General stair comments: Pt navigates stairs with good confidence and speed. No unsteadiness or LOB  Wheelchair Mobility    Modified Rankin (Stroke Patients Only)       Balance Overall balance assessment: Needs assistance Sitting-balance support: Bilateral upper extremity supported Sitting balance-Leahy Scale: Good     Standing balance support: Bilateral upper extremity supported Standing balance-Leahy Scale: Good                      Cognition Arousal/Alertness: Awake/alert Behavior During Therapy: WFL for tasks assessed/performed Overall Cognitive Status: Within Functional Limits for tasks assessed                      Exercises      General Comments        Pertinent Vitals/Pain Pain Assessment: 0-10 Pain Score: 3  Pain Location: abdomen Pain Descriptors / Indicators: Constant Pain Intervention(s): Limited activity within patient's tolerance;Monitored during session    Home Living                      Prior Function            PT Goals (current goals can now be found in the care plan section) Acute Rehab PT Goals Patient Stated Goal: to go practice stairs PT Goal Formulation: With patient Time For Goal Achievement: 09/08/15  Potential to Achieve Goals: Good Progress towards PT goals: Progressing toward goals    Frequency  Min 2X/week    PT Plan Current plan remains appropriate    Co-evaluation             End of Session Equipment Utilized During Treatment: Gait belt Activity Tolerance: Patient limited by fatigue Patient left: in bed;with bed alarm set;with family/visitor present;with call bell/phone within reach     Time:  1015-1040 PT Time Calculation (min) (ACUTE ONLY): 25 min  Charges:                       G CodesJanyth Contes 09-10-15, 1:34 PM  Janyth Contes, SPT. [phone]

## 2015-08-28 LAB — GLUCOSE, CAPILLARY
GLUCOSE-CAPILLARY: 163 mg/dL — AB (ref 65–99)
GLUCOSE-CAPILLARY: 148 mg/dL — AB (ref 65–99)
GLUCOSE-CAPILLARY: 146 mg/dL — AB (ref 65–99)
GLUCOSE-CAPILLARY: 149 mg/dL — AB (ref 65–99)
Glucose-Capillary: 148 mg/dL — ABNORMAL HIGH (ref 65–99)

## 2015-08-28 NOTE — Care Management Note (Signed)
Case Management Note  Patient Details  Name: Aaron Allen MRN: [id number] Date of Birth: [birth year]  Subjective/Objective:         Spoke with Mr Nieblas daughter Abigail Butts. Explained that [facility]-PT is recommending home health PT, but that the hospital discharging physician will have to put an order for whatever home health services he/she wants the patient to have at home. Abigail Butts stated "I does not matter what the patient wants? I would like for my Father's PCP Dr Tamala Julian to be involved with planning his home services." This writer advised Abigail Butts that she would have to discuss that with the physician who discharges her father.            Action/Plan:   Expected Discharge Date:                  Expected Discharge Plan:     In-House Referral:     Discharge planning Services     Post Acute Care Choice:    Choice offered to:     DME Arranged:    DME Agency:     HH Arranged:    Uncertain Agency:     Status of Service:     Medicare Important Message Given:  Yes-fourth notification given Date Medicare IM Given:    Medicare IM give by:    Date Additional Medicare IM Given:    Additional Medicare Important Message give by:     If discussed at [place] of Stay Meetings, dates discussed:    Additional Comments:  Rockett,Marilyn A, RN 08/28/2015, 2:11 PM

## 2015-08-28 NOTE — Progress Notes (Signed)
Patient ID: Aaron Allen, male   DOB: 1941-05-22, 74 y.o.   MRN: [id number] Pt appears to be doing well. Has had 2-3 bms. No n/v. VSS with HR still around 100. Lungs clear. Abdomen is soft, good bowel sounds. Incision looks clean. Advance diet. If stable will discharge home tomorrow. D/C telemetry.

## 2015-08-29 LAB — GLUCOSE, CAPILLARY: Glucose-Capillary: 110 mg/dL — ABNORMAL HIGH (ref 65–99)

## 2015-08-29 MED ORDER — OXYCODONE-ACETAMINOPHEN 5-325 MG PO TABS: 1.0000 | ORAL_TABLET | ORAL | Status: DC | PRN

## 2015-08-29 NOTE — Discharge Summary (Signed)
  DOA: 08/13/15  DOD: 08/29/15  Diagnosis: CA right colon with liver mets. Small bowel obstruction  Operations: Laparoscopy, right colectomy.                     Laparotomy, lysis adhesion, enterotomy for decompression.  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 He was brought in on [phone] for elective right hemicolectomy to prevent the continued blood loss and potential obstruction. Patient underwent the surgery and which were uneventful. Postoperative course was delayed significantly by development of abdominal distention and suspected ileus initially and later noted to be due to a small bowel obstruction. An upper GI and small bowel follow-through was obtained in view of persistent dilated small bowel and this showed no progress of the barium beyond the distal portion of the small bowel almost 24 hours later. Decision was made to reoperate. He was taken to the operating room on 08/24/2015 and laparotomy was performed. 2 areas of fibrinous adhesion of the small bowel in the right lower quadrant with the obstructing locations and these were easily freed. Since the patient had a large amount of barium retained in the bowel and NG tube was was used to decompress the stomach and an enterotomy made in the small bowel go the evacuated all the barium. His postoperative course now was uncomplicated after 48 hours he was noted to have a soft abdomen with good bowel sounds with the return of bowel activity. The course of her hospital stay the patient was noted some some weakness in the physical therapy consultation was obtained. He is currently ambulating reasonably well with the use of a walker tolerating a diet and incision is clean and healing satisfactorily. He maintains a high white count and it is suspected that this is likely on the basis of his metastatic tumor load in the in the lymph nodes  and liver. He has not shown any signs of infection.

## 2015-08-29 NOTE — Care Management Note (Signed)
Case Management Note  Patient Details  Name: WAYLAN BUSTA MRN: [id number] Date of Birth: [birth year]  Subjective/Objective:        Referral faxed to [facility] requesting home health RN and PT. MD order for a rolling walker was also faxed to [facility] with request to have rolling walker shipped to patient. Mr Caperton has a rollator at home that he will use until the rolling walker arrives.             Action/Plan:   Expected Discharge Date:                  Expected Discharge Plan:     In-House Referral:     Discharge planning Services     Post Acute Care Choice:    Choice offered to:     DME Arranged:    DME Agency:     HH Arranged:    [place] Agency:     Status of Service:     Medicare Important Message Given:  Yes-fourth notification given Date Medicare IM Given:    Medicare IM give by:    Date Additional Medicare IM Given:    Additional Medicare Important Message give by:     If discussed at [place] of Stay Meetings, dates discussed:    Additional Comments:  Rockett,Marilyn A, RN 08/29/2015, 11:35 AM

## 2015-08-29 NOTE — Progress Notes (Signed)
Pt is alert and oriented, states no pain at this time. Incision clean, dry and intact. IV removed per policy. Pt and daughter given discharged education. No questions or concerns expressed, work note given to patient. Patient discharged via wheelchair escorted by volunteer. Adah Salvage, RN.

## 2015-09-09 ENCOUNTER — Inpatient Hospital Stay: Payer: Medicare PPO | Attending: Oncology

## 2015-09-09 ENCOUNTER — Inpatient Hospital Stay (HOSPITAL_BASED_OUTPATIENT_CLINIC_OR_DEPARTMENT_OTHER): Payer: Medicare PPO | Admitting: Oncology

## 2015-09-09 VITALS — BP 107/63 | HR 114 | Temp 95.8°F | Wt 155.0 lb

## 2015-09-09 DIAGNOSIS — E119 Type 2 diabetes mellitus without complications: Secondary | ICD-10-CM | POA: Insufficient documentation

## 2015-09-09 DIAGNOSIS — E162 Hypoglycemia, unspecified: Secondary | ICD-10-CM | POA: Diagnosis not present

## 2015-09-09 DIAGNOSIS — R531 Weakness: Secondary | ICD-10-CM | POA: Diagnosis not present

## 2015-09-09 DIAGNOSIS — R109 Unspecified abdominal pain: Secondary | ICD-10-CM | POA: Insufficient documentation

## 2015-09-09 DIAGNOSIS — E785 Hyperlipidemia, unspecified: Secondary | ICD-10-CM

## 2015-09-09 DIAGNOSIS — C787 Secondary malignant neoplasm of liver and intrahepatic bile duct: Secondary | ICD-10-CM | POA: Diagnosis not present

## 2015-09-09 DIAGNOSIS — I129 Hypertensive chronic kidney disease with stage 1 through stage 4 chronic kidney disease, or unspecified chronic kidney disease: Secondary | ICD-10-CM | POA: Insufficient documentation

## 2015-09-09 DIAGNOSIS — Z79899 Other long term (current) drug therapy: Secondary | ICD-10-CM | POA: Diagnosis not present

## 2015-09-09 DIAGNOSIS — D509 Iron deficiency anemia, unspecified: Secondary | ICD-10-CM

## 2015-09-09 DIAGNOSIS — M129 Arthropathy, unspecified: Secondary | ICD-10-CM | POA: Diagnosis not present

## 2015-09-09 DIAGNOSIS — R188 Other ascites: Secondary | ICD-10-CM | POA: Diagnosis not present

## 2015-09-09 DIAGNOSIS — D630 Anemia in neoplastic disease: Secondary | ICD-10-CM

## 2015-09-09 DIAGNOSIS — I340 Nonrheumatic mitral (valve) insufficiency: Secondary | ICD-10-CM | POA: Diagnosis not present

## 2015-09-09 DIAGNOSIS — R7989 Other specified abnormal findings of blood chemistry: Secondary | ICD-10-CM | POA: Diagnosis not present

## 2015-09-09 DIAGNOSIS — C189 Malignant neoplasm of colon, unspecified: Secondary | ICD-10-CM

## 2015-09-09 DIAGNOSIS — Z87891 Personal history of nicotine dependence: Secondary | ICD-10-CM | POA: Insufficient documentation

## 2015-09-09 DIAGNOSIS — L723 Sebaceous cyst: Secondary | ICD-10-CM | POA: Insufficient documentation

## 2015-09-09 DIAGNOSIS — R5383 Other fatigue: Secondary | ICD-10-CM

## 2015-09-09 DIAGNOSIS — I10 Essential (primary) hypertension: Secondary | ICD-10-CM | POA: Insufficient documentation

## 2015-09-09 DIAGNOSIS — S0093XA Contusion of unspecified part of head, initial encounter: Secondary | ICD-10-CM

## 2015-09-09 LAB — CBC WITH DIFFERENTIAL/PLATELET
Basophils Absolute: 0 10*3/uL (ref 0–0.1)
Basophils Relative: 0 %
Eosinophils Absolute: 0 10*3/uL (ref 0–0.7)
Eosinophils Relative: 0 %
HEMATOCRIT: 26 % — AB (ref 40.0–52.0)
HEMOGLOBIN: 8.3 g/dL — AB (ref 13.0–18.0)
LYMPHS ABS: 2.5 10*3/uL (ref 1.0–3.6)
LYMPHS PCT: 12 %
MCH: 21.4 pg — AB (ref 26.0–34.0)
MCHC: 32.1 g/dL (ref 32.0–36.0)
MCV: 66.7 fL — AB (ref 80.0–100.0)
Monocytes Absolute: 1.7 10*3/uL — ABNORMAL HIGH (ref 0.2–1.0)
Monocytes Relative: 9 %
NEUTROS PCT: 79 %
Neutro Abs: 15.6 10*3/uL — ABNORMAL HIGH (ref 1.4–6.5)
Platelets: 600 10*3/uL — ABNORMAL HIGH (ref 150–440)
RBC: 3.9 MIL/uL — AB (ref 4.40–5.90)
RDW: 27.7 % — ABNORMAL HIGH (ref 11.5–14.5)
WBC: 19.8 10*3/uL — AB (ref 3.8–10.6)

## 2015-09-09 LAB — IRON AND TIBC
Iron: 13 ug/dL — ABNORMAL LOW (ref 45–182)
Saturation Ratios: 9 % — ABNORMAL LOW (ref 17.9–39.5)
TIBC: 143 ug/dL — ABNORMAL LOW (ref 250–450)
UIBC: 130 ug/dL

## 2015-09-09 LAB — FERRITIN: FERRITIN: 687 ng/mL — AB (ref 24–336)

## 2015-09-09 MED ORDER — MEGESTROL ACETATE 625 MG/5ML PO SUSP: 625.0000 mg | Freq: Every day | ORAL | Status: DC

## 2015-09-09 NOTE — Progress Notes (Signed)
Patient complains of feeling weak and fatigued. Also has no appetite.  Forces himself to eat.

## 2015-09-09 NOTE — Progress Notes (Signed)
Research study: "NSABP (MPR-1) The NSABP Patient Registry and Biospecimen Profiling Repository" Met with patient, son Ronnie Doss and daughter Abigail Butts today at patient appointment.  I introduced MPR-1 research study.  I explained that study does not require any additional testing and would use tissue already collected from his surgery in September.  Patient's daughter is going to take consent form home to review.  She was given copy of my business card so she may contact me with any questions.  Unless patient decides to decline to consent before next appointment, I will follow up with patient at his next appointment to consent him to study.  I spent approximately 15 minutes with patient and family.

## 2015-09-10 LAB — CEA: CEA: 191.6 ng/mL — ABNORMAL HIGH (ref 0.0–4.7)

## 2015-09-12 ENCOUNTER — Emergency Department: Payer: Medicare PPO

## 2015-09-12 ENCOUNTER — Encounter: Payer: Self-pay | Admitting: Emergency Medicine

## 2015-09-12 ENCOUNTER — Observation Stay
Admission: EM | Admit: 2015-09-12 | Discharge: 2015-09-15 | Disposition: A | Discharge status: Skilled Nursing Facility | Payer: Medicare PPO | Attending: Internal Medicine | Admitting: Internal Medicine

## 2015-09-12 DIAGNOSIS — E1122 Type 2 diabetes mellitus with diabetic chronic kidney disease: Secondary | ICD-10-CM | POA: Insufficient documentation

## 2015-09-12 DIAGNOSIS — E162 Hypoglycemia, unspecified: Secondary | ICD-10-CM | POA: Diagnosis present

## 2015-09-12 DIAGNOSIS — E785 Hyperlipidemia, unspecified: Secondary | ICD-10-CM | POA: Diagnosis not present

## 2015-09-12 DIAGNOSIS — Z79899 Other long term (current) drug therapy: Secondary | ICD-10-CM | POA: Insufficient documentation

## 2015-09-12 DIAGNOSIS — Z9049 Acquired absence of other specified parts of digestive tract: Secondary | ICD-10-CM | POA: Insufficient documentation

## 2015-09-12 DIAGNOSIS — D72829 Elevated white blood cell count, unspecified: Secondary | ICD-10-CM | POA: Diagnosis not present

## 2015-09-12 DIAGNOSIS — S0003XA Contusion of scalp, initial encounter: Secondary | ICD-10-CM | POA: Diagnosis not present

## 2015-09-12 DIAGNOSIS — R945 Abnormal results of liver function studies: Secondary | ICD-10-CM

## 2015-09-12 DIAGNOSIS — R188 Other ascites: Secondary | ICD-10-CM | POA: Diagnosis not present

## 2015-09-12 DIAGNOSIS — Z87891 Personal history of nicotine dependence: Secondary | ICD-10-CM | POA: Insufficient documentation

## 2015-09-12 DIAGNOSIS — L8990 Pressure ulcer of unspecified site, unspecified stage: Secondary | ICD-10-CM | POA: Diagnosis present

## 2015-09-12 DIAGNOSIS — R748 Abnormal levels of other serum enzymes: Secondary | ICD-10-CM | POA: Insufficient documentation

## 2015-09-12 DIAGNOSIS — R05 Cough: Secondary | ICD-10-CM | POA: Insufficient documentation

## 2015-09-12 DIAGNOSIS — R531 Weakness: Secondary | ICD-10-CM | POA: Insufficient documentation

## 2015-09-12 DIAGNOSIS — I1310 Hypertensive heart and chronic kidney disease without heart failure, with stage 1 through stage 4 chronic kidney disease, or unspecified chronic kidney disease: Secondary | ICD-10-CM | POA: Diagnosis not present

## 2015-09-12 DIAGNOSIS — M1990 Unspecified osteoarthritis, unspecified site: Secondary | ICD-10-CM | POA: Insufficient documentation

## 2015-09-12 DIAGNOSIS — W19XXXA Unspecified fall, initial encounter: Secondary | ICD-10-CM | POA: Diagnosis not present

## 2015-09-12 DIAGNOSIS — C182 Malignant neoplasm of ascending colon: Secondary | ICD-10-CM | POA: Diagnosis not present

## 2015-09-12 DIAGNOSIS — D638 Anemia in other chronic diseases classified elsewhere: Secondary | ICD-10-CM | POA: Diagnosis not present

## 2015-09-12 DIAGNOSIS — R7989 Other specified abnormal findings of blood chemistry: Secondary | ICD-10-CM | POA: Diagnosis present

## 2015-09-12 DIAGNOSIS — E11649 Type 2 diabetes mellitus with hypoglycemia without coma: Secondary | ICD-10-CM | POA: Diagnosis not present

## 2015-09-12 DIAGNOSIS — Z23 Encounter for immunization: Secondary | ICD-10-CM | POA: Diagnosis not present

## 2015-09-12 DIAGNOSIS — N183 Chronic kidney disease, stage 3 (moderate): Secondary | ICD-10-CM | POA: Insufficient documentation

## 2015-09-12 DIAGNOSIS — L723 Sebaceous cyst: Secondary | ICD-10-CM | POA: Insufficient documentation

## 2015-09-12 DIAGNOSIS — C787 Secondary malignant neoplasm of liver and intrahepatic bile duct: Secondary | ICD-10-CM | POA: Diagnosis not present

## 2015-09-12 DIAGNOSIS — E440 Moderate protein-calorie malnutrition: Secondary | ICD-10-CM | POA: Insufficient documentation

## 2015-09-12 DIAGNOSIS — D509 Iron deficiency anemia, unspecified: Secondary | ICD-10-CM | POA: Insufficient documentation

## 2015-09-12 DIAGNOSIS — I340 Nonrheumatic mitral (valve) insufficiency: Secondary | ICD-10-CM | POA: Diagnosis not present

## 2015-09-12 LAB — URINALYSIS COMPLETE WITH MICROSCOPIC (ARMC ONLY)
BILIRUBIN URINE: NEGATIVE
Glucose, UA: NEGATIVE mg/dL
KETONES UR: NEGATIVE mg/dL
Leukocytes, UA: NEGATIVE
Nitrite: NEGATIVE
PROTEIN: NEGATIVE mg/dL
Specific Gravity, Urine: 1.015 (ref 1.005–1.030)
pH: 5 (ref 5.0–8.0)

## 2015-09-12 LAB — HEPATIC FUNCTION PANEL
ALBUMIN: 1.9 g/dL — AB (ref 3.5–5.0)
ALK PHOS: 324 U/L — AB (ref 38–126)
ALT: 76 U/L — ABNORMAL HIGH (ref 17–63)
AST: 198 U/L — AB (ref 15–41)
BILIRUBIN TOTAL: 3.2 mg/dL — AB (ref 0.3–1.2)
Bilirubin, Direct: 2.1 mg/dL — ABNORMAL HIGH (ref 0.1–0.5)
Indirect Bilirubin: 1.1 mg/dL — ABNORMAL HIGH (ref 0.3–0.9)
Total Protein: 6.4 g/dL — ABNORMAL LOW (ref 6.5–8.1)

## 2015-09-12 LAB — GLUCOSE, CAPILLARY
GLUCOSE-CAPILLARY: 80 mg/dL (ref 65–99)
GLUCOSE-CAPILLARY: 55 mg/dL — AB (ref 65–99)
GLUCOSE-CAPILLARY: 109 mg/dL — AB (ref 65–99)
Glucose-Capillary: 64 mg/dL — ABNORMAL LOW (ref 65–99)
Glucose-Capillary: 58 mg/dL — ABNORMAL LOW (ref 65–99)
Glucose-Capillary: 106 mg/dL — ABNORMAL HIGH (ref 65–99)
Glucose-Capillary: 42 mg/dL — CL (ref 65–99)
Glucose-Capillary: 43 mg/dL — CL (ref 65–99)
Glucose-Capillary: 43 mg/dL — CL (ref 65–99)

## 2015-09-12 LAB — BASIC METABOLIC PANEL
Anion gap: 12 (ref 5–15)
BUN: 23 mg/dL — ABNORMAL HIGH (ref 6–20)
CALCIUM: 9 mg/dL (ref 8.9–10.3)
CO2: 25 mmol/L (ref 22–32)
CREATININE: 1.06 mg/dL (ref 0.61–1.24)
Chloride: 99 mmol/L — ABNORMAL LOW (ref 101–111)
GFR calc Af Amer: >60 mL/min (ref >60)
GFR calc non Af Amer: >60 mL/min (ref >60)
GLUCOSE: 84 mg/dL (ref 65–99)
Potassium: 3.9 mmol/L (ref 3.5–5.1)
Sodium: 136 mmol/L (ref 135–145)

## 2015-09-12 LAB — CBC
HCT: 27.3 % — ABNORMAL LOW (ref 40.0–52.0)
Hemoglobin: 8.7 g/dL — ABNORMAL LOW (ref 13.0–18.0)
MCH: 21.6 pg — AB (ref 26.0–34.0)
MCHC: 32 g/dL (ref 32.0–36.0)
MCV: 67.6 fL — ABNORMAL LOW (ref 80.0–100.0)
PLATELETS: 548 10*3/uL — AB (ref 150–440)
RBC: 4.04 MIL/uL — ABNORMAL LOW (ref 4.40–5.90)
RDW: 28 % — ABNORMAL HIGH (ref 11.5–14.5)
WBC: 21.9 10*3/uL — ABNORMAL HIGH (ref 3.8–10.6)

## 2015-09-12 LAB — HEMOGLOBIN A1C: HEMOGLOBIN A1C: 6.5 % — AB (ref 4.0–6.0)

## 2015-09-12 LAB — TROPONIN I: Troponin I: <0.03 ng/mL (ref <0.031)

## 2015-09-12 MED ORDER — NIFEDIPINE ER OSMOTIC RELEASE 90 MG PO TB24
90.0000 mg | ORAL_TABLET | Freq: Every day | ORAL | Status: DC
  Administered 2015-09-13 – 2015-09-15 (×3): 90 mg via ORAL
  Filled 2015-09-12 (×4): qty 1

## 2015-09-12 MED ORDER — ONDANSETRON HCL 4 MG PO TABS
4.0000 mg | ORAL_TABLET | Freq: Four times a day (QID) | ORAL | Status: DC | PRN
Start: 2015-09-12 — End: 2015-09-15

## 2015-09-12 MED ORDER — PANTOPRAZOLE SODIUM 40 MG PO TBEC
40.0000 mg | DELAYED_RELEASE_TABLET | Freq: Two times a day (BID) | ORAL | Status: DC
  Administered 2015-09-12 – 2015-09-15 (×6): 40 mg via ORAL
  Filled 2015-09-12 (×6): qty 1

## 2015-09-12 MED ORDER — DEXTROSE 10 % IV SOLN
INTRAVENOUS | Status: DC
  Administered 2015-09-12: 22:00:00 via INTRAVENOUS

## 2015-09-12 MED ORDER — QUINAPRIL HCL 10 MG PO TABS
40.0000 mg | ORAL_TABLET | Freq: Every day | ORAL | Status: DC
  Administered 2015-09-13 – 2015-09-15 (×3): 40 mg via ORAL
  Filled 2015-09-12 (×4): qty 4

## 2015-09-12 MED ORDER — DEXTROSE 50 % IV SOLN
25.0000 mL | Freq: Once | INTRAVENOUS | Status: AC
  Administered 2015-09-12: 50 mL via INTRAVENOUS

## 2015-09-12 MED ORDER — INFLUENZA VAC SPLIT QUAD 0.5 ML IM SUSY
0.5000 mL | PREFILLED_SYRINGE | INTRAMUSCULAR | Status: AC
  Administered 2015-09-13: 0.5 mL via INTRAMUSCULAR
  Filled 2015-09-12: qty 0.5

## 2015-09-12 MED ORDER — DEXTROSE 50 % IV SOLN
INTRAVENOUS | Status: AC
  Administered 2015-09-12: 50 mL
  Filled 2015-09-12: qty 50

## 2015-09-12 MED ORDER — OXYCODONE-ACETAMINOPHEN 5-325 MG PO TABS
1.0000 | ORAL_TABLET | ORAL | Status: DC | PRN
  Administered 2015-09-14: 1 via ORAL
  Filled 2015-09-12: qty 1

## 2015-09-12 MED ORDER — ACETAMINOPHEN 650 MG RE SUPP: 650.0000 mg | Freq: Four times a day (QID) | RECTAL | Status: DC | PRN

## 2015-09-12 MED ORDER — ONDANSETRON HCL 4 MG/2ML IJ SOLN: 4.0000 mg | Freq: Four times a day (QID) | INTRAMUSCULAR | Status: DC | PRN

## 2015-09-12 MED ORDER — ENOXAPARIN SODIUM 40 MG/0.4ML ~~LOC~~ SOLN
40.0000 mg | SUBCUTANEOUS | Status: DC
  Administered 2015-09-12 – 2015-09-13 (×2): 40 mg via SUBCUTANEOUS
  Filled 2015-09-12 (×2): qty 0.4

## 2015-09-12 MED ORDER — MEGESTROL ACETATE 625 MG/5ML PO SUSP
625.0000 mg | Freq: Every day | ORAL | Status: DC
  Administered 2015-09-13: 625 mg via ORAL
  Filled 2015-09-12 (×2): qty 5

## 2015-09-12 MED ORDER — DEXTROSE 50 % IV SOLN
INTRAVENOUS | Status: AC
  Administered 2015-09-12: 22:00:00 50 mL via INTRAVENOUS
  Filled 2015-09-12: qty 50

## 2015-09-12 MED ORDER — MEGESTROL ACETATE 625 MG/5ML PO SUSP: 625.0000 mg | Freq: Every day | ORAL | Status: DC

## 2015-09-12 MED ORDER — DEXTROSE-NACL 5-0.45 % IV SOLN
INTRAVENOUS | Status: DC
  Administered 2015-09-12: 13:00:00 via INTRAVENOUS

## 2015-09-12 MED ORDER — DEXTROSE-NACL 5-0.45 % IV SOLN
INTRAVENOUS | Status: DC
  Administered 2015-09-12: 16:00:00 via INTRAVENOUS

## 2015-09-12 MED ORDER — ACETAMINOPHEN 325 MG PO TABS: 650.0000 mg | ORAL_TABLET | Freq: Four times a day (QID) | ORAL | Status: DC | PRN

## 2015-09-12 NOTE — ED Notes (Signed)
Patient transported to Ultrasound 

## 2015-09-12 NOTE — ED Provider Notes (Addendum)
[facility] Emergency Department Provider Note   ____________________________________________  Time seen: On EMS arrival I have reviewed the triage vital signs and the triage nursing note.  HISTORY  Chief Complaint Hypoglycemia and [place] Patient, who is a limited historian Daughter who found him lying in the floor today.  HPI Aaron Allen is a 74 y.o. male who lives alone but his daughter lives next door. He typically gets up around 7:30 in the morning, and his daughter checked on him somewhere around 9 AM and found him on the floor. Patient states that he was sitting on the side of the bed and got up and felt dizzy and then fell. He did strike his head. He is unclear if he lost consciousness. He is not complaining of a headache. Reportedly when EMS got there patient's blood sugar was in the 30s. He last ate last night around 5 PM. He has had nothing to eat this morning and has not taken his glipizide or metformin this morning yet. After D50 his blood sugar came up about 100, and he was also given graham crackers here in the emergency department.  Family and patient report no recent illness such as cough, congestion, nausea, vomiting, diarrhea, abdominal pain, confusion or altered mental status, weakness, numbness, or any other acute illnesses.    Past Medical History  Diagnosis Date  . Diabetes mellitus without complication ([place])   . Hypertension   . HLD (hyperlipidemia)   . Arthritis   . Moderate mitral regurgitation     a. not noted on echo 08/2015 (EF 55-60%, Gr 1 DD, Ao sclerosis, mod-sev dil LA).  . Complication of anesthesia   . CKD II - III   . Metastatic colon cancer to liver ([place])     a. 08/13/2015 s/p lap R colectomy.  . Iron deficiency anemia     a. In setting of metastatic colon CA.    Patient Active Problem List   Diagnosis Date Noted  . Elevated troponin 08/18/2015  . Diabetes mellitus without complication ([place])   . HLD  (hyperlipidemia)   . Moderate mitral regurgitation   . CKD (chronic kidney disease), stage III   . Metastatic colon cancer to liver ([place])   . Cancer of right colon ([place]) 08/13/2015  . Anemia, iron deficiency 06/12/2015  . Leukocytosis 06/12/2015  . Generalized weakness 06/12/2015  . Anemia 06/11/2015  . Diabetes mellitus type 2 in nonobese ([place]) 06/11/2015  . Hypertension 06/11/2015    Past Surgical History  Procedure Laterality Date  . Colonoscopy with propofol N/A 07/19/2015    Procedure: COLONOSCOPY WITH PROPOFOL;  Surgeon: Josefine Class, MD;  Location[facility] ENDOSCOPY;  Service: Endoscopy;  Laterality: N/A;  . Esophagogastroduodenoscopy (egd) with propofol N/A 07/19/2015    Procedure: ESOPHAGOGASTRODUODENOSCOPY (EGD) WITH PROPOFOL;  Surgeon: Josefine Class, MD;  Location: [facility] ENDOSCOPY;  Service: Endoscopy;  Laterality: N/A;  . Laparoscopic right colectomy N/A 08/13/2015    Procedure: LAPAROSCOPIC RIGHT COLECTOMY;  Surgeon: Leonie Green, MD;  Location: [facility] ORS;  Service: General;  Laterality: N/A;  . Laparotomy N/A 08/24/2015    Procedure: EXPLORATORY LAPAROTOMY, ENTEROLYSIS OF ADHESIONS, ENTEROTOMY FOR DECOMPRESSION;  Surgeon: Christene Lye, MD;  Location: [facility] ORS;  Service: General;  Laterality: N/A;    Current Outpatient Rx  Name  Route  Sig  Dispense  Refill  . glipiZIDE (GLUCOTROL) 10 MG tablet   Oral   Take 10 mg by mouth 2 (two) times daily.          Marland Kitchen  megestrol (MEGACE ES) 625 MG/5ML suspension   Oral   Take 5 mLs (625 mg total) by mouth daily.   150 mL   2   . NIFEdipine (PROCARDIA XL/ADALAT-CC) 90 MG 24 hr tablet   Oral   Take 90 mg by mouth daily.         Marland Kitchen oxyCODONE-acetaminophen (PERCOCET/ROXICET) 5-325 MG per tablet   Oral   Take 1 tablet by mouth every 4 (four) hours as needed for moderate pain.   30 tablet   0   . pantoprazole (PROTONIX) 40 MG tablet   Oral   Take 1 tablet (40 mg total) by mouth 2 (two) times daily.  Switch for any other PPI at similar dose and frequency Patient taking differently: Take 40 mg by mouth 2 (two) times daily.    30 tablet   3   . pravastatin (PRAVACHOL) 40 MG tablet   Oral   Take 40 mg by mouth at bedtime.          . quinapril (ACCUPRIL) 20 MG tablet   Oral   Take 40 mg by mouth daily.            Allergies Review of patient's allergies indicates no known allergies.  Family History  Problem Relation Age of Onset  . Stroke Father   . Lung cancer Father     Social History Social History  Substance Use Topics  . Smoking status: Former Smoker    Quit date: 08/04/1966  . Smokeless tobacco: Current User    Types: Chew  . Alcohol Use: No    Review of Systems  Constitutional: Negative for fever. Eyes: Negative for visual changes. ENT: Negative for sore throat. Cardiovascular: Negative for chest pain. Respiratory: Negative for shortness of breath. Gastrointestinal: Negative for abdominal pain, vomiting and diarrhea. Genitourinary: Negative for dysuria. Musculoskeletal: Negative for back pain. Skin: Negative for rash. Neurological: Positive for mild headache, has hematoma to the forehead. 10 point Review of Systems otherwise negative ____________________________________________   PHYSICAL EXAM:  VITAL SIGNS: ED Triage Vitals  Enc Vitals Group     BP 09/12/15 0932 122/77 mmHg     Pulse Rate 09/12/15 0932 107     Resp 09/12/15 0932 18     Temp --      Temp src --      SpO2 09/12/15 0932 99 %     Weight 09/12/15 0932 153 lb (69.4 kg)     Height 09/12/15 0932 5\' 9"  (1.753 m)     Head Cir --      Peak Flow --      Pain Score 09/12/15 0933 0     Pain Loc --      Pain Edu? --      Excl. in [place]? --      Constitutional: Alert and cooperative. Well appearing and in no distress. Eyes: Conjunctivae are normal. PERRL. Normal extraocular movements. ENT   Head: Hematoma right forehead.   Nose: No congestion/rhinnorhea.   Mouth/Throat:  Mucous membranes are moist.   Neck: No stridor. Cardiovascular/Chest: Tachycardic, regular.  No murmurs, rubs, or gallops. Respiratory: Normal respiratory effort without tachypnea nor retractions. Breath sounds are clear and equal bilaterally. No wheezes/rales/rhonchi. Gastrointestinal: Soft. No distention, no guarding, no rebound. Nontender   Genitourinary/rectal:Deferred Musculoskeletal: Nontender with normal range of motion in all extremities. No joint effusions.  No lower extremity tenderness.  No edema. Neurologic:  Poor historian, however no dysarthria, expressive or receptive aphasia. No facial droop.  No  gross or focal neurologic deficits are appreciated. Skin:  Skin is warm, dry and intact. No rash noted.   ____________________________________________   EKG I, Lisa Roca, MD, the attending physician have personally viewed and interpreted all ECGs.  110 beats per minutes. Sinus tachycardia. Left axis deviation. Nonspecific intraventricular conduction delay. Normal ST and T-wave. ____________________________________________  LABS (pertinent positives/negatives)  Metabolic panel significant for chloride 99 and BUN 23 otherwise within normal limits White blood cell count 21.9, hemoglobin 8.7, platelet count 548 Troponin less than 0.03 Hepatic function panel significant for AST 198, a LT 76, alkaline phosphatase 324, bilirubin 3.2 Urinalysis pending ____________________________________________  RADIOLOGY All Xrays were viewed by me. Imaging interpreted by Radiologist.  Chest x-ray portable: Minimal bibasilar subsegmental atelectasis  CT head noncontrast:  IMPRESSION: 1. No acute intracranial abnormality. 2. Mild age-appropriate cortical atrophy. 3. Very small right frontal scalp hematoma without underlying skull Fracture.  Ultrasound right upper quadrant:  IMPRESSION: Gallbladder wall thickening may be due to incomplete distention, as patient was not fasting at  the time of the exam. Repeated imaging of the gallbladder after 8-10 hours of fasting may be considered, if acute cholecystitis is suspected.  Normal appearance of the common bile duct.  Diffusely heterogeneous echogenicity of the liver, likely due to known metastatic disease.  Small amount of abdominal ascites. __________________________________________  PROCEDURES  Procedure(s) performed: None  Critical Care performed: None  ____________________________________________   ED COURSE / ASSESSMENT AND PLAN  CONSULTATIONS: None  Pertinent labs & imaging results that were available during my care of the patient were reviewed by me and considered in my medical decision making (see chart for details).   The patient was brought here after her fall and having been found to be hypoglycemic. Is having is probably hypoglycemic due to the fact that he takes glipizide and didn't have anything to eat since 5 PM last night. He likely fell due to hypoglycemia. In terms of traumatic injury, I did discuss CT head evaluation with the family and decided proceed.  CT head showed no acute traumatic injury.  Laboratory evaluation showed elevated white blood cell count, which upon further investigation appears to be somewhat chronically elevated. However due to the elevated white blood cell count, I did add on chest x-ray as well as a right upper quadrant ultrasound due to also elevated liver function tests. His LFTs have not been this elevated past.  Ultrasound did not show any acute signs of cholecystitis, and the patient does have a known history of liver metastases and so it's likely that his elevated liver enzymes are due to the metastatic disease. Patient is not having fever, nor nausea, nor acute abdominal pain or pain on palpation, I feel this acute cholecystitis is unlikely clinically.  Patient is anemic, however this is chronic.  Patient's glucose decreased again to 64 despite initial D50  with EMS, followed by eating food in the emergency Department. Patient will need to be admitted for observation for hyperglycemia. Patient placed on D5 half normal saline. Urinalysis is pending  Patient / Family / Caregiver informed of clinical course, medical decision-making process, and agree with plan.   ___________________________________________   FINAL CLINICAL IMPRESSION(S) / ED DIAGNOSES   Final diagnoses:  Abnormal LFTs  Hypoglycemia       Lisa Roca, MD 09/12/15 [place], MD 09/12/15 1234

## 2015-09-12 NOTE — H&P (Signed)
[facility] NAME: Aaron Allen    MR#:  [id number]  DATE OF BIRTH:  Mar 26, 1941  DATE OF ADMISSION:  09/12/2015  PRIMARY CARE PHYSICIAN: Dion Body, MD   REQUESTING/REFERRING PHYSICIAN:   CHIEF COMPLAINT:   Chief Complaint  Patient presents with  . Hypoglycemia  . Fall    HISTORY OF PRESENT ILLNESS: Aaron Allen  is a 74 y.o. male with a known history of  colon cancer with metastatic disease to the liver. He typically gets up around 7:30 in the morning, and his daughter checked on him somewhere around 9 AM and found him on the floor. Patient states that he was sitting on the side of the bed and got up and felt dizzy and then fell. When EMS got there patient's blood sugar was in the 30s. Patient reports that he's been eating and drinking adequately. He reports that his blood sugars are usually in the 120s in the morning but lower in the evening time. He does take glipizide twice daily. Patient also was recently started on Megace. For appetite stimulation PAST MEDICAL HISTORY:   Past Medical History  Diagnosis Date  . Diabetes mellitus without complication ([place])   . Hypertension   . HLD (hyperlipidemia)   . Arthritis   . Moderate mitral regurgitation     a. not noted on echo 08/2015 (EF 55-60%, Gr 1 DD, Ao sclerosis, mod-sev dil LA).  . Complication of anesthesia   . CKD II - III   . Metastatic colon cancer to liver ([place])     a. 08/13/2015 s/p lap R colectomy.  . Iron deficiency anemia     a. In setting of metastatic colon CA.    PAST SURGICAL HISTORY:  Past Surgical History  Procedure Laterality Date  . Colonoscopy with propofol N/A 07/19/2015    Procedure: COLONOSCOPY WITH PROPOFOL;  Surgeon: Josefine Class, MD;  Location: [facility] ENDOSCOPY;  Service: Endoscopy;  Laterality: N/A;  . Esophagogastroduodenoscopy (egd) with propofol N/A 07/19/2015    Procedure: ESOPHAGOGASTRODUODENOSCOPY (EGD) WITH PROPOFOL;   Surgeon: Josefine Class, MD;  Location: [facility] ENDOSCOPY;  Service: Endoscopy;  Laterality: N/A;  . Laparoscopic right colectomy N/A 08/13/2015    Procedure: LAPAROSCOPIC RIGHT COLECTOMY;  Surgeon: Leonie Green, MD;  Location: [facility] ORS;  Service: General;  Laterality: N/A;  . Laparotomy N/A 08/24/2015    Procedure: EXPLORATORY LAPAROTOMY, ENTEROLYSIS OF ADHESIONS, ENTEROTOMY FOR DECOMPRESSION;  Surgeon: Christene Lye, MD;  Location: [facility] ORS;  Service: General;  Laterality: N/A;    SOCIAL HISTORY:  Social History  Substance Use Topics  . Smoking status: Former Smoker    Quit date: 08/04/1966  . Smokeless tobacco: Current User    Types: Chew  . Alcohol Use: No    FAMILY HISTORY:  Family History  Problem Relation Age of Onset  . Stroke Father   . Lung cancer Father     DRUG ALLERGIES: No Known Allergies  REVIEW OF SYSTEMS:   CONSTITUTIONAL: No fever, fatigue or weakness.  EYES: No blurred or double vision.  EARS, NOSE, AND THROAT: No tinnitus or ear pain.  RESPIRATORY: No cough, shortness of breath, wheezing or hemoptysis.  CARDIOVASCULAR: No chest pain, orthopnea, edema.  GASTROINTESTINAL: No nausea, vomiting, diarrhea or abdominal pain.  GENITOURINARY: No dysuria, hematuria.  ENDOCRINE: No polyuria, nocturia,  HEMATOLOGY: No anemia, easy bruising or bleeding SKIN: No rash or lesion. MUSCULOSKELETAL: No joint pain or arthritis.   NEUROLOGIC: No tingling, numbness,  weakness.  PSYCHIATRY: No anxiety or depression.   MEDICATIONS AT HOME:  Prior to Admission medications   Medication Sig Start Date End Date Taking? Authorizing Provider  glipiZIDE (GLUCOTROL) 10 MG tablet Take 10 mg by mouth 2 (two) times daily.    Yes Historical Provider, MD  megestrol (MEGACE ES) 625 MG/5ML suspension Take 5 mLs (625 mg total) by mouth daily. 09/09/15  Yes Lloyd Huger, MD  NIFEdipine (PROCARDIA XL/ADALAT-CC) 90 MG 24 hr tablet Take 90 mg by mouth daily.   Yes Historical  Provider, MD  oxyCODONE-acetaminophen (PERCOCET/ROXICET) 5-325 MG per tablet Take 1 tablet by mouth every 4 (four) hours as needed for moderate pain. 08/29/15  Yes Seeplaputhur Robinette Haines, MD  pravastatin (PRAVACHOL) 40 MG tablet Take 40 mg by mouth at bedtime.    Yes Historical Provider, MD  quinapril (ACCUPRIL) 20 MG tablet Take 40 mg by mouth daily.    Yes Historical Provider, MD  pantoprazole (PROTONIX) 40 MG tablet Take 1 tablet (40 mg total) by mouth 2 (two) times daily. Switch for any other PPI at similar dose and frequency Patient taking differently: Take 40 mg by mouth 2 (two) times daily.  06/12/15   Theodoro Grist, MD      PHYSICAL EXAMINATION:   VITAL SIGNS: Blood pressure 120/76, pulse 122, resp. rate 22, height 5\' 9"  (1.753 m), weight 69.4 kg (153 lb), SpO2 100 %.  GENERAL:  74 y.o.-year-old patient lying in the bed with no acute distress.  EYES: Pupils equal, round, reactive to light and accommodation. No scleral icterus. Extraocular muscles intact.  HEENT: Head atraumatic, normocephalic. Oropharynx and nasopharynx clear.  NECK:  Supple, no jugular venous distention. No thyroid enlargement, no tenderness.  LUNGS: Normal breath sounds bilaterally, no wheezing, rales,rhonchi or crepitation. No use of accessory muscles of respiration.  CARDIOVASCULAR: S1, S2 normal. No murmurs, rubs, or gallops.  ABDOMEN: Soft, nontender, nondistended. Bowel sounds present. No organomegaly or mass.  EXTREMITIES: No pedal edema, cyanosis, or clubbing.  NEUROLOGIC: Cranial nerves II through XII are intact. Muscle strength 5/5 in all extremities. Sensation intact. Gait not checked.  PSYCHIATRIC: [name].  SKIN: No obvious rash, lesion, or ulcer.   LABORATORY PANEL:   CBC  Recent Labs Lab 09/09/15 1025 09/12/15 0943  WBC 19.8* 21.9*  HGB 8.3* 8.7*  HCT 26.0* 27.3*  PLT 600* 548*  MCV 66.7* 67.6*  MCH 21.4* 21.6*  MCHC 32.1 32.0  RDW 27.7* 28.0*  LYMPHSABS 2.5   --   MONOABS 1.7*  --   EOSABS 0.0  --   BASOSABS 0.0  --    ------------------------------------------------------------------------------------------------------------------  Chemistries   Recent Labs Lab 09/12/15 0943  NA 136  K 3.9  CL 99*  CO2 25  GLUCOSE 84  BUN 23*  CREATININE 1.06  CALCIUM 9.0  AST 198*  ALT 76*  ALKPHOS 324*  BILITOT 3.2*   ------------------------------------------------------------------------------------------------------------------ estimated creatinine clearance is 58.2 mL/min (by C-G formula based on Cr of 1.06). ------------------------------------------------------------------------------------------------------------------ No results for input(s): TSH, T4TOTAL, T3FREE, THYROIDAB in the last 72 hours.  Invalid input(s): FREET3   Coagulation profile No results for input(s): INR, PROTIME in the last 168 hours. ------------------------------------------------------------------------------------------------------------------- No results for input(s): DDIMER in the last 72 hours. -------------------------------------------------------------------------------------------------------------------  Cardiac Enzymes  Recent Labs Lab 09/12/15 0943  TROPONINI <0.03   ------------------------------------------------------------------------------------------------------------------ Invalid input(s): POCBNP  ---------------------------------------------------------------------------------------------------------------  Urinalysis    Component Value Date/Time   COLORURINE AMBER* 09/12/2015 1225   APPEARANCEUR CLEAR* 09/12/2015  1225   LABSPEC 1.015 09/12/2015 1225   PHURINE 5.0 09/12/2015 1225   GLUCOSEU NEGATIVE 09/12/2015 1225   HGBUR 2+* 09/12/2015 1225   BILIRUBINUR NEGATIVE 09/12/2015 1225   KETONESUR NEGATIVE 09/12/2015 1225   PROTEINUR NEGATIVE 09/12/2015 1225   NITRITE NEGATIVE 09/12/2015 1225   LEUKOCYTESUR NEGATIVE 09/12/2015  1225     RADIOLOGY: Ct Head Wo Contrast  09/12/2015   CLINICAL DATA:  74 year old found unresponsive lying in the floor at home earlier today with blood glucose of 33. Patient responded to an amp of D50 IV. Patient struck the right side of the forehead when he fell sustaining a small hematoma. Two weeks status post resection of colon cancer. Initial encounter.  EXAM: CT HEAD WITHOUT CONTRAST  TECHNIQUE: Contiguous axial images were obtained from the base of the skull through the vertex without intravenous contrast.  COMPARISON:  None.  FINDINGS: Ventricular system normal in size and appearance for age. Mild age-appropriate cortical atrophy. Mega cisterna magna, an anatomic variant. No mass lesion. No midline shift. No acute hemorrhage or hematoma. No extra-axial fluid collections. No evidence of acute infarction.  Very small right frontal scalp hematoma without underlying skull fracture. Visualized paranasal sinuses, bilateral mastoid air cells and bilateral middle ear cavities well-aerated. Approximate 1.3 cm sebaceous cyst in the right frontal scalp near the midline. Moderate bilateral carotid siphon atherosclerosis.  IMPRESSION: 1. No acute intracranial abnormality. 2. Mild age-appropriate cortical atrophy. 3. Very small right frontal scalp hematoma without underlying skull fracture.   Electronically Signed   By: Evangeline Dakin M.D.   On: 09/12/2015 10:40   Dg Chest Port 1 View  09/12/2015   CLINICAL DATA:  Elevated white blood count.  EXAM: PORTABLE CHEST 1 VIEW  COMPARISON:  August 26, 2015.  FINDINGS: The heart size and mediastinal contours are within normal limits. No pneumothorax or pleural effusion is noted. Minimal bibasilar subsegmental atelectasis is noted. The visualized skeletal structures are unremarkable.  IMPRESSION: Minimal bibasilar subsegmental atelectasis.   Electronically Signed   By: Marijo Conception, M.D.   On: 09/12/2015 10:46   US Abdomen Limited Ruq  09/12/2015   CLINICAL  DATA:  Abnormal liver function tests.  EXAM: US ABDOMEN LIMITED - RIGHT UPPER QUADRANT  COMPARISON:  CT of the abdomen pelvis dated 07/27/2015  FINDINGS: Gallbladder:  The patient is nonfasting. The gallbladder is incompletely visualized due to its partially collapsed state. The gallbladder wall is thickened to 4.7 mm, which may be artifactual. The sonographic Percell Miller sign is negative.  Common bile duct:  Diameter: 3.4 mm.  Liver:  The liver demonstrate diffusely heterogeneous echogenicity, likely due to known hepatic metastatic disease.  There is small amount of free fluid within the abdomen.  IMPRESSION: Gallbladder wall thickening may be due to incomplete distention, as patient was not fasting at the time of the exam. Repeated imaging of the gallbladder after 8-10 hours of fasting may be considered, if acute cholecystitis is suspected.  Normal appearance of the common bile duct.  Diffusely heterogeneous echogenicity of the liver, likely due to known metastatic disease.  Small amount of abdominal ascites.   Electronically Signed   By: Fidela Salisbury M.D.   On: 09/12/2015 11:55    EKG: Orders placed or performed during the hospital encounter of 09/12/15  . ED EKG  . ED EKG    IMPRESSION AND PLAN: Patient is a 74 year old African-American male with colon cancer with metastases to the liver is brought to the emergency room with hypoglycemia  1.  Hypoglycemia in the setting of patient taking sulfonylurea, with diagnosis of colon cancer with metastases to the liver. At this time I will stop his glipizide, check blood glucose every 4 hours. I will place him on a D5 containing fluid. Also he is noted to have elevated LFTs which can be playing a role in the hypoglycemia is liver function tests were normal. There is concern that maybe there is progression of metastatic liver disease.  2. Elevated LFTs in the setting of metastatic disease to the liver I will stop his cholesterol medication, check ultrasound  of the abdomen to check for worsening metastatic disease  3. Hypertension continue Procardia and quinapril  4. Leukocytosis: No fever patient had leukocytosis in September possibly related to his cancer, we'll check a urinalysis and follow CBC obtain blood cultures  5. Colon cancer with metastases patient will be evaluated by oncology in the near future for chemotherapy  All the records are reviewed and case discussed with ED provider. Management plans discussed with the patient, family and they are in agreement.  CODE STATUS: Full    TOTAL TIME TAKING CARE OF THIS PATIENT: 55 minutes.    Dustin Flock M.D on 09/12/2015 at 1:07 PM  Between 7am to 6pm - Pager - [phone]  After 6pm go to [email or web address] - password [email or web address] [facility]  Office  [phone]  CC: Primary care physician; Dion Body, MD

## 2015-09-12 NOTE — ED Notes (Signed)
Patient was assisted to stand at bedside to use urinal. Patient is unsteady.

## 2015-09-12 NOTE — ED Notes (Signed)
Pt here from home via [facility], pt found lying in floor at home this AM, CBG 33. EMS reports giving an amp of D50, CBG then 118. EMS reports pt became alert and oriented, pt with hematoma to right forehead. Pt with hx of colon cancer, recent surgery 2 weeks ago. Pt denies any pain at this time, pt is alert and oriented. CBG checked upon arrival, currently 80.

## 2015-09-12 NOTE — ED Notes (Signed)
Patient transported to CT 

## 2015-09-12 NOTE — ED Notes (Signed)
Patient returns from Ultrasound.

## 2015-09-12 NOTE — ED Notes (Signed)
Pt given graham crackers and milk.  

## 2015-09-12 NOTE — Plan of Care (Signed)
Problem: Consults Goal: Diabetes Guidelines if Diabetic/Glucose > 140 If diabetic or lab glucose is > 140 mg/dl - Initiate Diabetes/Hyperglycemia Guidelines & Document Interventions  Outcome: Progressing Patient admitted today with hypoglycemia, found face down in floor this morning with a blood sugar of 36 ct of head was negative, Patient with a colon cancer history with mets to liver.  Alert and oriented recently had surgery for bowel obstruction and was at this hospital.  When arrived on floor blood sugar in 50's given 1 amp of d 50 and juice it came up to 100, on continuous fluids with d5.  Patient weak, very unsteady and has been getting up with one assist to bedside commode,  Bilateral lower ext very swollen on admission, going for abdominal ultrasound in morning and npo after midnight.

## 2015-09-13 ENCOUNTER — Observation Stay: Payer: Medicare PPO

## 2015-09-13 DIAGNOSIS — L8990 Pressure ulcer of unspecified site, unspecified stage: Secondary | ICD-10-CM | POA: Diagnosis present

## 2015-09-13 DIAGNOSIS — E440 Moderate protein-calorie malnutrition: Secondary | ICD-10-CM | POA: Diagnosis present

## 2015-09-13 LAB — GLUCOSE, CAPILLARY
GLUCOSE-CAPILLARY: 157 mg/dL — AB (ref 65–99)
GLUCOSE-CAPILLARY: 63 mg/dL — AB (ref 65–99)
GLUCOSE-CAPILLARY: 81 mg/dL (ref 65–99)
GLUCOSE-CAPILLARY: 81 mg/dL (ref 65–99)
Glucose-Capillary: 74 mg/dL (ref 65–99)
Glucose-Capillary: 71 mg/dL (ref 65–99)
Glucose-Capillary: 65 mg/dL (ref 65–99)
Glucose-Capillary: 109 mg/dL — ABNORMAL HIGH (ref 65–99)
Glucose-Capillary: 89 mg/dL (ref 65–99)
Glucose-Capillary: 73 mg/dL (ref 65–99)
Glucose-Capillary: 117 mg/dL — ABNORMAL HIGH (ref 65–99)

## 2015-09-13 LAB — CBC
HCT: 23.1 % — ABNORMAL LOW (ref 40.0–52.0)
HEMOGLOBIN: 7.4 g/dL — AB (ref 13.0–18.0)
MCH: 21.8 pg — AB (ref 26.0–34.0)
MCHC: 32 g/dL (ref 32.0–36.0)
MCV: 68.1 fL — ABNORMAL LOW (ref 80.0–100.0)
PLATELETS: 431 10*3/uL (ref 150–440)
RBC: 3.4 MIL/uL — ABNORMAL LOW (ref 4.40–5.90)
RDW: 27.1 % — ABNORMAL HIGH (ref 11.5–14.5)
WBC: 18.5 10*3/uL — ABNORMAL HIGH (ref 3.8–10.6)

## 2015-09-13 LAB — BASIC METABOLIC PANEL
Anion gap: 11 (ref 5–15)
BUN: 21 mg/dL — AB (ref 6–20)
CALCIUM: 8.5 mg/dL — AB (ref 8.9–10.3)
CO2: 23 mmol/L (ref 22–32)
CREATININE: 0.96 mg/dL (ref 0.61–1.24)
Chloride: 101 mmol/L (ref 101–111)
Glucose, Bld: 80 mg/dL (ref 65–99)
Potassium: 3.7 mmol/L (ref 3.5–5.1)
SODIUM: 135 mmol/L (ref 135–145)

## 2015-09-13 MED ORDER — ENSURE ENLIVE PO LIQD
237.0000 mL | Freq: Three times a day (TID) | ORAL | Status: DC
  Administered 2015-09-13 – 2015-09-15 (×7): 237 mL via ORAL

## 2015-09-13 MED ORDER — DEXTROSE 50 % IV SOLN
25.0000 mL | Freq: Once | INTRAVENOUS | Status: AC
  Administered 2015-09-13: 25 mL via INTRAVENOUS

## 2015-09-13 MED ORDER — DEXTROSE 50 % IV SOLN
INTRAVENOUS | Status: AC
  Administered 2015-09-13: 08:00:00
  Filled 2015-09-13: qty 50

## 2015-09-13 NOTE — Plan of Care (Signed)
Problem: Discharge Progression Outcomes Goal: Other Discharge Outcomes/Goals Outcome: Progressing Plan of Care Progressing to Goal: Patient has no complaints of pain. VSS. Tolerating diet well, does not have much appetite. Encouraged to eat and drink. Blood sugars checked every 4 hours. Up to [facility] with 1 assist. Telemetry monitored.

## 2015-09-13 NOTE — Progress Notes (Signed)
Dr Velora Heckler sodium diet

## 2015-09-13 NOTE — Progress Notes (Signed)
Blood sugar 63. Patient given orange juice and peanut butter crackers

## 2015-09-13 NOTE — Progress Notes (Addendum)
[place] [facility] [place] NAME: Aaron Allen    MR#:  [id number]  DATE OF BIRTH:  05/05/1941  SUBJECTIVE:  CHIEF COMPLAINT:   Chief Complaint  Patient presents with  . Hypoglycemia  . Fall   generalized weakness  REVIEW OF SYSTEMS:  CONSTITUTIONAL: No fever, has generalized weakness.  EYES: No blurred or double vision.  EARS, NOSE, AND THROAT: No tinnitus or ear pain.  RESPIRATORY: No cough, shortness of breath, wheezing or hemoptysis.  CARDIOVASCULAR: No chest pain, orthopnea, edema.  GASTROINTESTINAL: No nausea, vomiting, diarrhea or abdominal pain.  GENITOURINARY: No dysuria, hematuria.  ENDOCRINE: No polyuria, nocturia,  HEMATOLOGY: No anemia, easy bruising or bleeding SKIN: No rash or lesion. MUSCULOSKELETAL: No joint pain or arthritis.   NEUROLOGIC: No tingling, numbness, weakness.  PSYCHIATRY: No anxiety or depression.   DRUG ALLERGIES:  No Known Allergies  VITALS:  Blood pressure 113/66, pulse 116, temperature 98.7 F (37.1 C), temperature source Oral, resp. rate 20, height 5\' 9"  (1.753 m), weight 69.4 kg (153 lb), SpO2 98 %.  PHYSICAL EXAMINATION:  GENERAL:  74 y.o.-year-old patient lying in the bed with no acute distress. Thin. EYES: Pupils equal, round, reactive to light and accommodation. No scleral icterus. Extraocular muscles intact.  HEENT: Head atraumatic, normocephalic. Oropharynx and nasopharynx clear.  NECK:  Supple, no jugular venous distention. No thyroid enlargement, no tenderness.  LUNGS: Normal breath sounds bilaterally, no wheezing, rales,rhonchi or crepitation. No use of accessory muscles of respiration.  CARDIOVASCULAR: S1, S2 normal. No murmurs, rubs, or gallops.  ABDOMEN: Soft, nontender, nondistended. Bowel sounds present. No organomegaly or mass.  EXTREMITIES: No pedal edema, cyanosis, or clubbing.  NEUROLOGIC: Cranial nerves II through XII are intact. Muscle strength 4/5 in all extremities.  Sensation intact. Gait not checked.  PSYCHIATRIC: [name].  SKIN: No obvious rash, lesion, but has pressure ulcer.    LABORATORY PANEL:   CBC  Recent Labs Lab 09/13/15 0430  WBC 18.5*  HGB 7.4*  HCT 23.1*  PLT 431   ------------------------------------------------------------------------------------------------------------------  Chemistries   Recent Labs Lab 09/12/15 0943 09/13/15 0430  NA 136 135  K 3.9 3.7  CL 99* 101  CO2 25 23  GLUCOSE 84 80  BUN 23* 21*  CREATININE 1.06 0.96  CALCIUM 9.0 8.5*  AST 198*  --   ALT 76*  --   ALKPHOS 324*  --   BILITOT 3.2*  --    ------------------------------------------------------------------------------------------------------------------  Cardiac Enzymes  Recent Labs Lab 09/12/15 0943  TROPONINI <0.03   ------------------------------------------------------------------------------------------------------------------  RADIOLOGY:  Ct Head Wo Contrast  09/12/2015   CLINICAL DATA:  74 year old found unresponsive lying in the floor at home earlier today with blood glucose of 33. Patient responded to an amp of D50 IV. Patient struck the right side of the forehead when he fell sustaining a small hematoma. Two weeks status post resection of colon cancer. Initial encounter.  EXAM: CT HEAD WITHOUT CONTRAST  TECHNIQUE: Contiguous axial images were obtained from the base of the skull through the vertex without intravenous contrast.  COMPARISON:  None.  FINDINGS: Ventricular system normal in size and appearance for age. Mild age-appropriate cortical atrophy. Mega cisterna magna, an anatomic variant. No mass lesion. No midline shift. No acute hemorrhage or hematoma. No extra-axial fluid collections. No evidence of acute infarction.  Very small right frontal scalp hematoma without underlying skull fracture. Visualized paranasal sinuses, bilateral mastoid air cells and bilateral middle ear cavities  well-aerated. Approximate 1.3 cm sebaceous cyst in the right frontal scalp near the midline. Moderate bilateral carotid siphon atherosclerosis.  IMPRESSION: 1. No acute intracranial abnormality. 2. Mild age-appropriate cortical atrophy. 3. Very small right frontal scalp hematoma without underlying skull fracture.   Electronically Signed   By: Evangeline Dakin M.D.   On: 09/12/2015 10:40   US Abdomen Complete  09/13/2015   CLINICAL DATA:  Elevated liver function tests. Current history of colon cancer.  EXAM: ULTRASOUND ABDOMEN COMPLETE  COMPARISON:  CT scan of July 27, 2015.  FINDINGS: Gallbladder: Gallbladder is contracted. No gallstones or wall thickening visualized. No sonographic Murphy sign noted.  Common bile duct: Diameter: 3.7 mm which is within normal limits.  Liver: Multiple rounded hyperdense foci are noted consistent with metastatic disease as described on prior exam.  IVC: No abnormality visualized.  Pancreas: Visualized portion unremarkable.  Spleen: Size and appearance within normal limits.  Right Kidney: Length: 11 cm. Echogenicity within normal limits. No mass or hydronephrosis visualized.  Left Kidney: Length: 10 cm. Echogenicity within normal limits. No mass or hydronephrosis visualized.  Abdominal aorta: No aneurysm visualized.  Other findings: Minimal fluid is noted around the liver.  IMPRESSION: Findings consistent with multiple hepatic metastases. No biliary dilatation or cholelithiasis is noted.   Electronically Signed   By: Marijo Conception, M.D.   On: 09/13/2015 10:16   Dg Chest Port 1 View  09/12/2015   CLINICAL DATA:  Elevated white blood count.  EXAM: PORTABLE CHEST 1 VIEW  COMPARISON:  August 26, 2015.  FINDINGS: The heart size and mediastinal contours are within normal limits. No pneumothorax or pleural effusion is noted. Minimal bibasilar subsegmental atelectasis is noted. The visualized skeletal structures are unremarkable.  IMPRESSION: Minimal bibasilar subsegmental  atelectasis.   Electronically Signed   By: Marijo Conception, M.D.   On: 09/12/2015 10:46   US Abdomen Limited Ruq  09/12/2015   CLINICAL DATA:  Abnormal liver function tests.  EXAM: US ABDOMEN LIMITED - RIGHT UPPER QUADRANT  COMPARISON:  CT of the abdomen pelvis dated 07/27/2015  FINDINGS: Gallbladder:  The patient is nonfasting. The gallbladder is incompletely visualized due to its partially collapsed state. The gallbladder wall is thickened to 4.7 mm, which may be artifactual. The sonographic Percell Miller sign is negative.  Common bile duct:  Diameter: 3.4 mm.  Liver:  The liver demonstrate diffusely heterogeneous echogenicity, likely due to known hepatic metastatic disease.  There is small amount of free fluid within the abdomen.  IMPRESSION: Gallbladder wall thickening may be due to incomplete distention, as patient was not fasting at the time of the exam. Repeated imaging of the gallbladder after 8-10 hours of fasting may be considered, if acute cholecystitis is suspected.  Normal appearance of the common bile duct.  Diffusely heterogeneous echogenicity of the liver, likely due to known metastatic disease.  Small amount of abdominal ascites.   Electronically Signed   By: Fidela Salisbury M.D.   On: 09/12/2015 11:55    EKG:   Orders placed or performed during the hospital encounter of 09/12/15  . ED EKG  . ED EKG    ASSESSMENT AND PLAN:   1. Hypoglycemia in the setting of patient taking sulfonylurea,   Hold glipizide, continue D5 IV.   2. Elevated LFTs in the setting of metastatic disease to the liver  hold cholesterol medication,  ultrasound of the abdomen shows multiple hepatic metastases.  3. Hypertension continue Procardia and quinapril  4. Leukocytosis:  patient had leukocytosis  in September possibly related to his cancer,  No UTI. 5. Colon cancer with metastases. Follow-up oncology in the near future for chemotherapy  * Anemia of chronic disease. Hemoglobin is down to 7.4. But no  active bleeding, follow-up CBC. * Moderate malnutrition. Regular diet and Follow-up dietitian's recommendation.  PT evaluation.  All the records are reviewed and case discussed with Care Management/Social Workerr. Management plans discussed with the patient, his daughter and they are in agreement.  CODE STATUS: Full code  TOTAL TIME TAKING CARE OF THIS PATIENT: 37 minutes.   POSSIBLE D/C IN 2 DAYS, DEPENDING ON CLINICAL CONDITION.   Demetrios Loll M.D on 09/13/2015 at 1:44 PM  Between 7am to 6pm - Pager - [phone]  After 6pm go to [email or web address] - password [email or web address] [place] [facility]  Office  [phone]  CC: Primary care physician; Dion Body, MD

## 2015-09-13 NOTE — Progress Notes (Signed)
Initial Nutrition Assessment  DOCUMENTATION CODES:   Non-severe (moderate) malnutrition in context of chronic illness  INTERVENTION:    Meals and Snacks: Cater to patient preferences. May need to liberalize diet order if intake poor. Medical Food Supplement Therapy: will recommend Ensure Enlive po TID, each supplement provides 350 kcal and 20 grams of protein   NUTRITION DIAGNOSIS:   Malnutrition related to cancer and cancer related treatments as evidenced by percent weight loss, moderate depletions of muscle mass.  GOAL:   Patient will meet greater than or equal to 90% of their needs  MONITOR:    (Energy Intake, Glucose Profile, Electrolyte and renal Profile, Anthropometrics)  REASON FOR ASSESSMENT:   Malnutrition Screening Tool    ASSESSMENT:   Pt admitted with hypoglycemia secondary to sulfonylurea. Pt with recent admission 08/13/2015 with lap colectomy placed secondray to colon cancer. Of note pt also with liver mets per MD note.  Past Medical History  Diagnosis Date  . Diabetes mellitus without complication ([name])   . Hypertension   . HLD (hyperlipidemia)   . Arthritis   . Moderate mitral regurgitation     a. not noted on echo 08/2015 (EF 55-60%, Gr 1 DD, Ao sclerosis, mod-sev dil LA).  . Complication of anesthesia   . CKD II - III   . Metastatic colon cancer to liver (Aaron Allen)     a. 08/13/2015 s/p lap R colectomy.  . Iron deficiency anemia     a. In setting of metastatic colon CA.    Diet Order:  Diet 2 gram sodium Room service appropriate?: Yes; Fluid consistency:: Thin    Current Nutrition: Pt NPO this am on visit.   Food/Nutrition-Related History: Pt reports eating 2 meals per day PTA. Pt reports liking Vanilla Ensure and drinking that as well PTA. Pt reports last meal eaten was last night, recorded po intake 30% on dinner.     Medications: D10 at 11mL/hr (providing 408kcals in 24 hours), Megace, Protonix  Electrolyte/Renal Profile and Glucose Profile:    Recent Labs Lab 09/12/15 0943 09/13/15 0430  NA 136 135  K 3.9 3.7  CL 99* 101  CO2 25 23  BUN 23* 21*  CREATININE 1.06 0.96  CALCIUM 9.0 8.5*  GLUCOSE 84 80   Protein Profile:   Recent Labs Lab 09/12/15 0943  ALBUMIN 1.9*    Gastrointestinal Profile: Last BM: 09/13/2015   Nutrition-Focused Physical Exam Findings: Nutrition-Focused physical exam completed. Findings are no fat depletion, mild-moderate muscle depletion, and 2+ edema of BLLE.     Weight Change: Pt with 13% weight loss in the past 2 months per [facility].    Height:   Ht Readings from Last 1 Encounters:  09/12/15 5\' 9"  (1.753 m)    Weight:   Wt Readings from Last 1 Encounters:  09/12/15 153 lb (69.4 kg)    Wt Readings from Last 10 Encounters:  09/12/15 153 lb (69.4 kg)  09/09/15 154 lb 15.7 oz (70.3 kg)  08/25/15 165 lb 12.8 oz (75.206 kg)  07/22/15 171 lb 1.2 oz (77.599 kg)  07/12/15 173 lb (78.472 kg)  06/11/15 176 lb (79.833 kg)     BMI:  Body mass index is 22.58 kg/(m^2).  Estimated Nutritional Needs:   Kcal:  BEE: 1405kcals, TEE: (IF 1.1-1.3)(AF 1.2) 1855-2195kcals  Protein:  75-90g protein (1.1-1.3g/kg)   Fluid:  1725-2027mL of fluid (25-15mL/kg)  EDUCATION NEEDS:   Education needs no appropriate at this time    [place], RD, LDN Pager (  [phone]  

## 2015-09-13 NOTE — Plan of Care (Signed)
Problem: Discharge Progression Outcomes Goal: Other Discharge Outcomes/Goals Outcome: Progressing Pt is alert and oriented, no c/o pain at this time. Blood sugar in the 40s. Given orange juice and graham crackers with no improvement with the 15 minute sugar check. MD notified, D10 at 50 ml/hr ordered, an amp of D50 ordered with improvement. 1 assist to the [facility]. Pt is NPO after midnight for abdominal ultrasound, will continue to assess.

## 2015-09-13 NOTE — Evaluation (Addendum)
Physical Therapy Evaluation Patient Details Name: Aaron Allen MRN: [id number] DOB: Sep 29, 1941 Today's Date: 09/13/2015   History of Present Illness  74 yo male who is currently under observation due to hypoglycemia and fall with generalized weakness. Pt recently underwent laparoscopic colectomy for treatment of mass on colon, with leukocytosis and tachycardia, demand ischemia and elevated troponin. He has also been tachycardic during this admission. PMHx:  CKD 3, DM, and colon cancer with liver mets. Pt recently had hypoglycemic episode and fell off edge of bed however previously he denies falls in the last 12 months. Pt reports that since last discharge he has been using rolling walker for ambulation at home and receiving HH PT.  Clinical Impression  Pt demonstrates deconditioning and weakness as well as poor activity tolerance. LE strength is 4- to 4/5 throughout. Pt requiring assistance and multiple attempts to come to standing. He ambulation with crouched posture and decreased speed. HR peaks around 150 during ambulation and pt with difficulty carrying conversation. Pt with poor cardiopulmonary endurance. Single leg balance is <2 seconds bilaterally. Pt will need SNF placement at discharge. If pt does not meet 3 day admission criteria please arrange [facility] PT and 24/7 assistance due to risk for future falls and readmission. Pt will benefit from skilled PT services to address deficits in strength, balance, and mobility in order to return to full function at home.     Follow Up Recommendations SNF (If not admitted please arrange [facility] PT and 24/7 assist)    Equipment Recommendations  None recommended by PT    Recommendations for Other Services       Precautions / Restrictions Precautions Precautions: Fall Restrictions Weight Bearing Restrictions: No      Mobility  Bed Mobility Overal bed mobility: Needs Assistance Bed Mobility: Sit to Supine       Sit to supine: Supervision   General  bed mobility comments: Pt received sitting at EOB after CNA assist transfer back to bed. Upon returning to bed pt requires cues for sequencing to return to bed  Transfers Overall transfer level: Needs assistance Equipment used: Rolling walker (2 wheeled) Transfers: Sit to/from Stand Sit to Stand: Min assist         General transfer comment: Pt with notable weakness and in LE requiring multiple attempts to come to standing and minA+1 assist due to LE weakness. Pt with poor anterior weight shifting during transfer.   Ambulation/Gait Ambulation/Gait assistance: Min guard Ambulation Distance (Feet): 30 Feet Assistive device: Rolling walker (2 wheeled) Gait Pattern/deviations: Shuffle Gait velocity: re Gait velocity interpretation: Below normal speed for age/gender General Gait Details: Pt with crouched posture during ambulation. He fatigues very quickly and HR peaks at 150 bpm. Pt returned to sitting and HR slow but never goes below 130 bpm. Pt with LE weakness noted during ambulation. Poor scanning of visual environment and poor endurance with ambulation. Further ambulation distance deferred at this time due to elevated HR. Pt denies chest pain.  Stairs            Wheelchair Mobility    Modified Rankin (Stroke Patients Only)       Balance Overall balance assessment: Needs assistance   Sitting balance-Leahy Scale: Good       Standing balance-Leahy Scale: Fair Standing balance comment: Pt able to achieve modified sharpened Rhomberg without UE support and maintains stance for 10 seconds. Single leg stance is <2 seconds bilaterally.  Pertinent Vitals/Pain Pain Assessment: No/denies pain    Home Living Family/patient expects to be discharged to:: Private residence Living Arrangements: Alone Available Help at Discharge: Family;Available PRN/intermittently;Other (Comment) (Daugther lives next door) Type of Home: House Home Access:  Stairs to enter Entrance Stairs-Rails: Right Entrance Stairs-Number of Steps: 3 Home Layout: Two level;Other (Comment) Home Equipment: Shower seat;Grab bars - tub/shower;Walker - 2 wheels (no BSC)      Prior Function Level of Independence: Independent with assistive device(s)         Comments: Assist with IADLs from daughter     Hand Dominance   Dominant Hand: Right    Extremity/Trunk Assessment   Upper Extremity Assessment: Generalized weakness           Lower Extremity Assessment: Generalized weakness         Communication   Communication: No difficulties  Cognition Arousal/Alertness: Awake/alert Behavior During Therapy: WFL for tasks assessed/performed Overall Cognitive Status: Impaired/Different from baseline Area of Impairment: Orientation Orientation Level: Disoriented to;Time (Family reports he is normally oriented to time)                  General Comments      Exercises        Assessment/Plan    PT Assessment Patient needs continued PT services  PT Diagnosis Difficulty walking;Generalized weakness;Acute pain   PT Problem List Decreased strength;Decreased activity tolerance;Decreased mobility;Decreased balance;Decreased safety awareness  PT Treatment Interventions Gait training;DME instruction;Therapeutic exercise;Therapeutic activities;Balance training;Neuromuscular re-education   PT Goals (Current goals can be found in the Care Plan section) Acute Rehab PT Goals Patient Stated Goal: "I want to get stronger" PT Goal Formulation: With patient Time For Goal Achievement: 09/27/15 Potential to Achieve Goals: Good    Frequency Min 2X/week   Barriers to discharge Decreased caregiver support;Inaccessible home environment stairs to enter house, family cannot provide 24/7 assistance    Co-evaluation               End of Session Equipment Utilized During Treatment: Gait belt Activity Tolerance: Patient limited by fatigue Patient  left: in bed;with bed alarm set;with family/visitor present;with call bell/phone within reach      Functional Assessment Tool Used: clinical judgement/single leg stance Functional Limitation: Mobility: Walking and moving around Mobility: Walking and Moving Around Current Status (Q0086): At least 20 percent but less than 40 percent impaired, limited or restricted Mobility: Walking and Moving Around Goal Status [phone]): At least 1 percent but less than 20 percent impaired, limited or restricted    Time: 1545-1610 PT Time Calculation (min) (ACUTE ONLY): 25 min   Charges:   PT Evaluation $Initial PT Evaluation Tier I: 1 Procedure     PT G Codes:   PT G-Codes **NOT FOR INPATIENT CLASS** Functional Assessment Tool Used: clinical judgement/single leg stance Functional Limitation: Mobility: Walking and moving around Mobility: Walking and Moving Around Current Status (K9326): At least 20 percent but less than 40 percent impaired, limited or restricted Mobility: Walking and Moving Around Goal Status [phone]): At least 1 percent but less than 20 percent impaired, limited or restricted   Phillips Grout PT, DPT   Sherolyn Trettin 09/13/2015, 4:31 PM

## 2015-09-14 LAB — CBC
HEMATOCRIT: 22.4 % — AB (ref 40.0–52.0)
HEMOGLOBIN: 7.2 g/dL — AB (ref 13.0–18.0)
MCH: 21.7 pg — ABNORMAL LOW (ref 26.0–34.0)
MCHC: 32.1 g/dL (ref 32.0–36.0)
MCV: 67.6 fL — ABNORMAL LOW (ref 80.0–100.0)
Platelets: 388 10*3/uL (ref 150–440)
RBC: 3.32 MIL/uL — ABNORMAL LOW (ref 4.40–5.90)
RDW: 26.7 % — AB (ref 11.5–14.5)
WBC: 19 10*3/uL — AB (ref 3.8–10.6)

## 2015-09-14 LAB — GLUCOSE, CAPILLARY
GLUCOSE-CAPILLARY: 199 mg/dL — AB (ref 65–99)
GLUCOSE-CAPILLARY: 305 mg/dL — AB (ref 65–99)
GLUCOSE-CAPILLARY: 199 mg/dL — AB (ref 65–99)
GLUCOSE-CAPILLARY: 219 mg/dL — AB (ref 65–99)
Glucose-Capillary: 218 mg/dL — ABNORMAL HIGH (ref 65–99)
Glucose-Capillary: 217 mg/dL — ABNORMAL HIGH (ref 65–99)
Glucose-Capillary: 220 mg/dL — ABNORMAL HIGH (ref 65–99)

## 2015-09-14 MED ORDER — FERROUS SULFATE 325 (65 FE) MG PO TABS
325.0000 mg | ORAL_TABLET | Freq: Three times a day (TID) | ORAL | Status: DC
  Administered 2015-09-14 – 2015-09-15 (×2): 325 mg via ORAL
  Filled 2015-09-14 (×2): qty 1

## 2015-09-14 MED ORDER — INSULIN ASPART 100 UNIT/ML ~~LOC~~ SOLN
0.0000 [IU] | Freq: Three times a day (TID) | SUBCUTANEOUS | Status: DC
  Administered 2015-09-14 (×2): 3 [IU] via SUBCUTANEOUS
  Administered 2015-09-15: 1 [IU] via SUBCUTANEOUS
  Administered 2015-09-15: 2 [IU] via SUBCUTANEOUS
  Filled 2015-09-14 (×3): qty 3
  Filled 2015-09-14: qty 5
  Filled 2015-09-14: qty 2

## 2015-09-14 MED ORDER — METFORMIN HCL 500 MG PO TABS
500.0000 mg | ORAL_TABLET | Freq: Two times a day (BID) | ORAL | Status: DC
  Administered 2015-09-14 – 2015-09-15 (×2): 500 mg via ORAL
  Filled 2015-09-14 (×2): qty 1

## 2015-09-14 MED ORDER — INSULIN ASPART 100 UNIT/ML ~~LOC~~ SOLN
0.0000 [IU] | Freq: Every day | SUBCUTANEOUS | Status: DC
  Administered 2015-09-14: 2 [IU] via SUBCUTANEOUS
  Filled 2015-09-14: qty 2

## 2015-09-14 NOTE — Progress Notes (Signed)
Per [facility] admissions coordinator at [facility] authorization has been received. Plan is for patient to D/C to [facility] tomorrow 09/15/15. Clinical Social Worker (CSW) will continue to follow and assist as needed.   Blima Rich, [place] [phone]

## 2015-09-14 NOTE — Progress Notes (Signed)
[facility]  Patient Status: active  [facility] is providing the following services: SN/PT  If patient discharges after hours, please call [phone].   Aaron Allen 09/14/2015, 3:31 PM

## 2015-09-14 NOTE — Progress Notes (Signed)
Physical Therapy Treatment Patient Details Name: Aaron Allen MRN: [id number] DOB: 1941-01-07 Today's Date: 09/14/2015    History of Present Illness 74 yo male who is currently under observation due to hypoglycemia and fall with generalized weakness. Pt recently underwent laparoscopic colectomy for treatment of mass on colon, with leukocytosis and tachycardia, demand ischemia and elevated troponin. He has also been tachycardic during this admission. PMHx:  CKD 3, DM, and colon cancer with liver mets. Pt recently had hypoglycemic episode and fell off edge of bed however previously he denies falls in the last 12 months. Pt reports that since last discharge he has been using rolling walker for ambulation at home and receiving HH PT.    PT Comments    Pt hesitant, but agreeable to PT. Family notes pt just received lunch and he/they wish him to eat lunch. Pt agreeable to up in chair for lunch. Decreased assist required for bed mobility. Pt requires cueing for safe hand placement with sit to/from stand transfers and controlled descent with sit. Pt comfortable in chair and set up to eat; family member to stay and assist pt with feeding. Continue PT for progression of strength and all functional mobility.   Follow Up Recommendations  SNF     Equipment Recommendations  None recommended by PT    Recommendations for Other Services       Precautions / Restrictions Precautions Precautions: Fall Restrictions Weight Bearing Restrictions: No    Mobility  Bed Mobility Overal bed mobility: Needs Assistance Bed Mobility: Supine to Sit     Supine to sit: Min assist     General bed mobility comments: Very limited assist to complete elevating trunk  Transfers Overall transfer level: Needs assistance Equipment used: Rolling walker (2 wheeled) Transfers: Sit to/from Stand Sit to Stand: Min assist (cues for safe hand placement)         General transfer comment: cues for safe hand placement  uncontrolled descent to chair  Ambulation/Gait Ambulation/Gait assistance: Min guard Ambulation Distance (Feet): 3 Feet Assistive device: Rolling walker (2 wheeled) Gait Pattern/deviations:  (sidesteps to chair)     General Gait Details: wished only to chair as family notes he just received lunch and wishes to eat   Stairs            Wheelchair Mobility    Modified Rankin (Stroke Patients Only)       Balance Overall balance assessment: Needs assistance         Standing balance support: Bilateral upper extremity supported Standing balance-Leahy Scale: Fair                      Cognition Arousal/Alertness: Awake/alert Behavior During Therapy: WFL for tasks assessed/performed Overall Cognitive Status: Impaired/Different from baseline                      Exercises      General Comments        Pertinent Vitals/Pain Pain Assessment: No/denies pain    Home Living                      Prior Function            PT Goals (current goals can now be found in the care plan section) Progress towards PT goals: Progressing toward goals    Frequency  Min 2X/week    PT Plan Current plan remains appropriate    Co-evaluation  End of Session Equipment Utilized During Treatment: Gait belt Activity Tolerance:  (limited session, as pt wishes to eat lunch) Patient left: in chair;with call bell/phone within reach;with chair alarm set;with family/visitor present     Time: 1410-1423 PT Time Calculation (min) (ACUTE ONLY): 13 min  Charges:  $Gait Training: 8-22 mins                    G Codes:      Charlaine Dalton 09/14/2015, 2:36 PM

## 2015-09-14 NOTE — Progress Notes (Signed)
Called Dr Bridgett Larsson to let him know patient had a 6 beat run of SVT 15:37 on strip,. He acknowledged no new order

## 2015-09-14 NOTE — Plan of Care (Signed)
Problem: Discharge Progression Outcomes Goal: Other Discharge Outcomes/Goals Outcome: Progressing Plan of care progress to goal: Pt complains of no pain, just having trouble getting comfortable tonight. Pt running sinus tach CBGs WDL during shift/no coverage needed

## 2015-09-14 NOTE — Clinical Social Work Placement (Signed)
   CLINICAL SOCIAL WORK PLACEMENT  NOTE  Date:  09/14/2015  Patient Details  Name: Aaron Allen MRN: [id number] Date of Birth: Aug 09, 1941  Clinical Social Work is seeking post-discharge placement for this patient at the [place] level of care (*CSW will initial, date and re-position this form in  chart as items are completed):  Yes   Patient/family provided with [facility] Work Department's list of facilities offering this level of care within the geographic area requested by the patient (or if unable, by the patient's family).  Yes   Patient/family informed of their freedom to choose among providers that offer the needed level of care, that participate in Medicare, Medicaid or managed care program needed by the patient, have an available bed and are willing to accept the patient.  Yes   Patient/family informed of [facility]'s ownership interest in [facility] and [facility], as well as of the fact that they are under no obligation to receive care at these facilities.  PASRR submitted to EDS on 09/14/15     PASRR number received on 09/14/15     Existing PASRR number confirmed on       FL2 transmitted to all facilities in geographic area requested by pt/family on 09/14/15     FL2 transmitted to all facilities within larger geographic area on       Patient informed that his/her managed care company has contracts with or will negotiate with certain facilities, including the following:        Yes   Patient/family informed of bed offers received.  Patient chooses bed at  [facility] )     Physician recommends and patient chooses bed at      Patient to be transferred to   on  .  Patient to be transferred to facility by       Patient family notified on   of transfer.  Name of family member notified:        PHYSICIAN Please sign FL2     Additional Comment:    _______________________________________________ Loralyn Freshwater,  LCSW 09/14/2015, 2:00 PM

## 2015-09-14 NOTE — Clinical Social Work Note (Signed)
Clinical Social Work Assessment  Patient Details  Name: Aaron Allen MRN: [id number] Date of Birth: 07-Feb-1941  Date of referral:  09/14/15               Reason for consult:  Facility Placement                Permission sought to share information with:  Chartered certified accountant granted to share information::  Yes, Verbal Permission Granted  Name::      Fish farm manager::   [name]   Relationship::     Contact Information:     Housing/Transportation Living arrangements for the past 2 months:  [place] of Information:  Patient, Adult Children Patient Interpreter Needed:  None Criminal Activity/Legal Involvement Pertinent to Current Situation/Hospitalization:  No - Comment as needed Significant Relationships:  Adult Children Lives with:  Self Do you feel safe going back to the place where you live?  Yes Need for family participation in patient care:  Yes (Comment)  Care giving concerns:  Patient lives alone in [place].    Social Worker assessment / plan:  Holiday representative (CSW) received verbal SNF consult. PT is recommending SNF. CSW met with patient and his daughter Aaron Allen [phone] was at bedside. Patient was alert and oriented and sitting up in the bed. CSW introduced self and explained role of CSW department. Patient and daughter are agreeable to SNF search in [place]. Daughter prefers Radiation protection practitioner or [employer] CSW explained that patient's insurance [employer] will require authorization for SNF. Daughter verbalized her understanding. Daughter reported that patient is going to start Chemo Therapy after rehab when he has more strength.   FL2 complete and faxed out. CSW presented bed offers. Patient and daughter chose [facility]. Doug admissions coordinator at [facility] started [place] authorization today. CSW will continue to follow and assist as needed.   Employment status:  Disabled  (Comment on whether or not currently receiving Disability), Retired Nurse, adult PT Recommendations:  [place] / Referral to community resources:  [place]  Patient/Family's Response to care: Patient and daughter are in agreement for patient to go to [facility].   Patient/Family's Understanding of and Emotional Response to Diagnosis, Current Treatment, and Prognosis:  Patient and daughter thanked CSW for visit and assisting with placement.   Emotional Assessment Appearance:  Appears stated age Attitude/Demeanor/Rapport:    Affect (typically observed):  Accepting, Adaptable, Pleasant Orientation:  Oriented to Self, Oriented to Place, Oriented to  Time, Oriented to Situation Alcohol / Substance use:  Not Applicable Psych involvement (Current and /or in the community):  No (Comment)  Discharge Needs  Concerns to be addressed:  Discharge Planning Concerns Readmission within the last 30 days:  No Current discharge risk:  Chronically ill Barriers to Discharge:  Continued Medical Work up   Loralyn Freshwater, LCSW 09/14/2015, 2:01 PM

## 2015-09-14 NOTE — Discharge Instructions (Signed)
You were evaluated after fall and found to have low blood sugar. Your exam and evaluation shows no serious traumatic injury. Your liver function tests were elevated, which is somewhat worse than previous, and felt to be due to the metastatic cancer on the liver. Your white blood cell count has been chronically elevated, and I do not think that there is new acute infection going on. Do not take your glipizide at night until further instructed to do so by her primary care physician. It may be that your dose is too high right now at night, leading to low blood sugar in the morning.  Return to the emergency department for any new or worsening condition including confusion, altered mental status, dizziness, passing out, new injury, chest pain, abdominal pain, fever, or any other symptoms concerning to you.   Hypoglycemia Low blood sugar (hypoglycemia) means that the level of sugar in your blood is lower than it should be. Signs of low blood sugar include:  Getting sweaty.  Feeling hungry.  Feeling dizzy or weak.  Feeling sleepier than normal.  Feeling nervous.  Headaches.  Having a fast heartbeat. Low blood sugar can happen fast and can be an emergency. Your doctor can do tests to check your blood sugar level. You can have low blood sugar and not have diabetes. HOME CARE  Check your blood sugar as told by your doctor. If it is less than 70 mg/dl or as told by your doctor, take 1 of the following:  3 to 4 glucose tablets.   cup clear juice.   cup soda pop, not diet.  1 cup milk.  5 to 6 hard candies.  Recheck blood sugar after 15 minutes. Repeat until it is at the right level.  Eat a snack if it is more than 1 hour until the next meal.  Only take medicine as told by your doctor.  Do not skip meals. Eat on time.  Do not drink alcohol except with meals.  Check your blood glucose before driving.  Check your blood glucose before and after exercise.  Always carry treatment  with you, such as glucose pills.  Always wear a medical alert bracelet if you have diabetes. GET HELP RIGHT AWAY IF:   Your blood glucose goes below 70 mg/dl or as told by your doctor, and you:  Are confused.  Are not able to swallow.  Pass out (faint).  You cannot treat yourself. You may need someone to help you.  You have low blood sugar problems often.  You have problems from your medicines.  You are not feeling better after 3 to 4 days.  You have vision changes. MAKE SURE YOU:   Understand these instructions.  Will watch this condition.  Will get help right away if you are not doing well or get worse.   This information is not intended to replace advice given to you by your health care provider. Make sure you discuss any questions you have with your health care provider.   Document Released: 02/14/2010 Document Revised: 12/11/2014 Document Reviewed: 07/27/2015 Elsevier Interactive Patient Education 2016 [employer].  Heart healthy and ADA diet. Activity as tolerated.

## 2015-09-14 NOTE — Progress Notes (Addendum)
[place] [facility] [place] NAME: Aaron Allen    MR#:  [id number]  DATE OF BIRTH:  02-26-1941  SUBJECTIVE:  CHIEF COMPLAINT:   Chief Complaint  Patient presents with  . Hypoglycemia  . Fall   no complaint.  REVIEW OF SYSTEMS:  CONSTITUTIONAL: No fever, no generalized weakness.  EYES: No blurred or double vision.  EARS, NOSE, AND THROAT: No tinnitus or ear pain.  RESPIRATORY: No cough, shortness of breath, wheezing or hemoptysis.  CARDIOVASCULAR: No chest pain, orthopnea, edema.  GASTROINTESTINAL: No nausea, vomiting, diarrhea or abdominal pain.  GENITOURINARY: No dysuria, hematuria.  ENDOCRINE: No polyuria, nocturia,  HEMATOLOGY: No anemia, easy bruising or bleeding SKIN: No rash or lesion. MUSCULOSKELETAL: No joint pain or arthritis.   NEUROLOGIC: No tingling, numbness, weakness.  PSYCHIATRY: No anxiety or depression.   DRUG ALLERGIES:  No Known Allergies  VITALS:  Blood pressure 109/65, pulse 113, temperature 97.7 F (36.5 C), temperature source Oral, resp. rate 18, height 5\' 9"  (1.753 m), weight 69.4 kg (153 lb), SpO2 99 %.  PHYSICAL EXAMINATION:  GENERAL:  74 y.o.-year-old patient lying in the bed with no acute distress. Thin. EYES: Pupils equal, round, reactive to light and accommodation. No scleral icterus. Extraocular muscles intact.  HEENT: Head atraumatic, normocephalic. Oropharynx and nasopharynx clear.  NECK:  Supple, no jugular venous distention. No thyroid enlargement, no tenderness.  LUNGS: Normal breath sounds bilaterally, no wheezing, rales,rhonchi or crepitation. No use of accessory muscles of respiration.  CARDIOVASCULAR: S1, S2 normal. No murmurs, rubs, or gallops.  ABDOMEN: Soft, nontender, nondistended. Bowel sounds present. No organomegaly or mass.  EXTREMITIES: No pedal edema, cyanosis, or clubbing.  NEUROLOGIC: Cranial nerves II through XII are intact. Muscle strength 4/5 in all extremities. Sensation  intact. Gait not checked.  PSYCHIATRIC: [name].  SKIN: No obvious rash, lesion, but has Sacral pressure ulcer stage III .    LABORATORY PANEL:   CBC  Recent Labs Lab 09/14/15 0452  WBC 19.0*  HGB 7.2*  HCT 22.4*  PLT 388   ------------------------------------------------------------------------------------------------------------------  Chemistries   Recent Labs Lab 09/12/15 0943 09/13/15 0430  NA 136 135  K 3.9 3.7  CL 99* 101  CO2 25 23  GLUCOSE 84 80  BUN 23* 21*  CREATININE 1.06 0.96  CALCIUM 9.0 8.5*  AST 198*  --   ALT 76*  --   ALKPHOS 324*  --   BILITOT 3.2*  --    ------------------------------------------------------------------------------------------------------------------  Cardiac Enzymes  Recent Labs Lab 09/12/15 0943  TROPONINI <0.03   ------------------------------------------------------------------------------------------------------------------  RADIOLOGY:  US Abdomen Complete  09/13/2015   CLINICAL DATA:  Elevated liver function tests. Current history of colon cancer.  EXAM: ULTRASOUND ABDOMEN COMPLETE  COMPARISON:  CT scan of July 27, 2015.  FINDINGS: Gallbladder: Gallbladder is contracted. No gallstones or wall thickening visualized. No sonographic Murphy sign noted.  Common bile duct: Diameter: 3.7 mm which is within normal limits.  Liver: Multiple rounded hyperdense foci are noted consistent with metastatic disease as described on prior exam.  IVC: No abnormality visualized.  Pancreas: Visualized portion unremarkable.  Spleen: Size and appearance within normal limits.  Right Kidney: Length: 11 cm. Echogenicity within normal limits. No mass or hydronephrosis visualized.  Left Kidney: Length: 10 cm. Echogenicity within normal limits. No mass or hydronephrosis visualized.  Abdominal aorta: No aneurysm visualized.  Other findings: Minimal fluid is noted around the liver.  IMPRESSION: Findings consistent with  multiple hepatic metastases. No biliary dilatation or cholelithiasis is noted.   Electronically Signed   By: Marijo Conception, M.D.   On: 09/13/2015 10:16    EKG:   Orders placed or performed during the hospital encounter of 09/12/15  . ED EKG  . ED EKG    ASSESSMENT AND PLAN:   1. DM2 with Hypoglycemia in the setting of patient taking sulfonylurea,   Hold glipizide, discontinued D5 IVYesterday.  blood sugar is elevated about 200. Start sliding scale and metformin.  2. Elevated LFTs in the setting of metastatic disease to the liver  hold cholesterol medication,  ultrasound of the abdomen shows multiple hepatic metastases. Follow-up oncology as outpatient.   3. Hypertension continue Procardia and quinapril  4. Leukocytosis:  patient had leukocytosis in September possibly related to his cancer,  No UTI. 5. Colon cancer with metastases. Follow-up oncology in the near future for chemotherapy  * Anemia of chronic disease with iron deficiency. Hemoglobin is down to 7.2. But no active bleeding,  start ferrous sulfate 3 times a day and follow-up CBC.  * Moderate malnutrition. Regular diet and Follow-up dietitian's recommendation.  PT evaluation suggested skilled nursing facility placement..  All the records are reviewed and case discussed with Care Management/Social Workerr. Management plans discussed with the patient, his daughter and they are in agreement.  CODE STATUS: Full code  TOTAL TIME TAKING CARE OF THIS PATIENT: 35 minutes.   POSSIBLE D/C IN 1-2 DAYS, DEPENDING ON CLINICAL CONDITION.   Demetrios Loll M.D on 09/14/2015 at 2:17 PM  Between 7am to 6pm - Pager - [phone]  After 6pm go to [email or web address] - password [email or web address] [facility]  Office  [phone]  CC: Primary care physician; Dion Body, MD

## 2015-09-15 LAB — CBC
HEMATOCRIT: 21 % — AB (ref 40.0–52.0)
HEMOGLOBIN: 6.8 g/dL — AB (ref 13.0–18.0)
MCH: 22.1 pg — AB (ref 26.0–34.0)
MCHC: 32.5 g/dL (ref 32.0–36.0)
MCV: 68 fL — AB (ref 80.0–100.0)
Platelets: 361 10*3/uL (ref 150–440)
RBC: 3.1 MIL/uL — AB (ref 4.40–5.90)
RDW: 26.6 % — ABNORMAL HIGH (ref 11.5–14.5)
WBC: 17.6 10*3/uL — ABNORMAL HIGH (ref 3.8–10.6)

## 2015-09-15 LAB — GLUCOSE, CAPILLARY
GLUCOSE-CAPILLARY: 152 mg/dL — AB (ref 65–99)
GLUCOSE-CAPILLARY: 165 mg/dL — AB (ref 65–99)
Glucose-Capillary: 154 mg/dL — ABNORMAL HIGH (ref 65–99)
Glucose-Capillary: 128 mg/dL — ABNORMAL HIGH (ref 65–99)
Glucose-Capillary: 172 mg/dL — ABNORMAL HIGH (ref 65–99)

## 2015-09-15 LAB — PREPARE RBC (CROSSMATCH)

## 2015-09-15 MED ORDER — METFORMIN HCL 500 MG PO TABS: 500.0000 mg | ORAL_TABLET | Freq: Two times a day (BID) | ORAL | Status: DC

## 2015-09-15 MED ORDER — SODIUM CHLORIDE 0.9 % IV SOLN
Freq: Once | INTRAVENOUS | Status: AC
  Administered 2015-09-15: 08:00:00 via INTRAVENOUS

## 2015-09-15 MED ORDER — FERROUS SULFATE 325 (65 FE) MG PO TABS: 325.0000 mg | ORAL_TABLET | Freq: Three times a day (TID) | ORAL | Status: DC

## 2015-09-15 NOTE — Discharge Summary (Addendum)
Aaron Allen at [facility] NAME: Aaron Allen    MR#:  [id number]  DATE OF BIRTH:  1941-03-03  DATE OF ADMISSION:  09/12/2015 ADMITTING PHYSICIAN: Dustin Flock, MD  DATE OF DISCHARGE: 09/15/2015  PRIMARY CARE PHYSICIAN: Dion Body, MD    ADMISSION DIAGNOSIS:  Hypoglycemia [E16.2] Elevated LFTs [R79.89] Abnormal LFTs [R79.89]   DISCHARGE DIAGNOSIS:   DM2 with Hypoglycemia Elevated LFTs  Anemia of chronic disease with iron deficiency SECONDARY DIAGNOSIS:   Past Medical History  Diagnosis Date  . Diabetes mellitus without complication ([place])   . Hypertension   . HLD (hyperlipidemia)   . Arthritis   . Moderate mitral regurgitation     a. not noted on echo 08/2015 (EF 55-60%, Gr 1 DD, Ao sclerosis, mod-sev dil LA).  . Complication of anesthesia   . CKD II - III   . Metastatic colon cancer to liver ([place])     a. 08/13/2015 s/p lap R colectomy.  . Iron deficiency anemia     a. In setting of metastatic colon CA.    HOSPITAL COURSE:   1. DM2 with Hypoglycemia in the setting of patient taking sulfonylurea,   Glipizide was discontinued after admission. He was treated with D50 and D5 IV, which was discontinued after blood sugar was elevated about 200. I started sliding scale and metformin. Blood sugar is controlled.  2. Elevated LFTs in the setting of metastatic disease to the liver hold cholesterol medication, ultrasound of the abdomen shows multiple hepatic metastases. Follow-up oncology as outpatient.   3. Hypertension continue Procardia and quinapril  4. Leukocytosis: patient had leukocytosis in September possibly related to his cancer, No UTI. 5. Colon cancer with metastases. Follow-up oncology in the near future for chemotherapy  * Anemia of chronic disease with iron deficiency. The patient has generalized weakness and tachycardia, Hemoglobin is down to 6.8 today. But no active bleeding, started ferrous sulfate 3  times a day yesterday. The patient will receive 2 units PRBC transfusion today and follow-up CBC as outpatient.  * Moderate malnutrition. Regular diet and Follow-up dietitian's recommendation.   DISCHARGE CONDITIONS:   Stable, discharged to skilled nursing facility today.  CONSULTS OBTAINED:     DRUG ALLERGIES:  No Known Allergies  DISCHARGE MEDICATIONS:   Current Discharge Medication List    START taking these medications   Details  ferrous sulfate 325 (65 FE) MG tablet Take 1 tablet (325 mg total) by mouth 3 (three) times daily with meals. Qty: 90 tablet, Refills: 0    metFORMIN (GLUCOPHAGE) 500 MG tablet Take 1 tablet (500 mg total) by mouth 2 (two) times daily with a meal. Qty: 30 tablet, Refills: 0      CONTINUE these medications which have NOT CHANGED   Details  megestrol (MEGACE ES) 625 MG/5ML suspension Take 5 mLs (625 mg total) by mouth daily. Qty: 150 mL, Refills: 2    NIFEdipine (PROCARDIA XL/ADALAT-CC) 90 MG 24 hr tablet Take 90 mg by mouth daily.    pravastatin (PRAVACHOL) 40 MG tablet Take 40 mg by mouth at bedtime.     quinapril (ACCUPRIL) 20 MG tablet Take 40 mg by mouth daily.     pantoprazole (PROTONIX) 40 MG tablet Take 1 tablet (40 mg total) by mouth 2 (two) times daily. Switch for any other PPI at similar dose and frequency Qty: 30 tablet, Refills: 3      STOP taking these medications     glipiZIDE (GLUCOTROL) 10 MG tablet  oxyCODONE-acetaminophen (PERCOCET/ROXICET) 5-325 MG per tablet          DISCHARGE INSTRUCTIONS:    If you experience worsening of your admission symptoms, develop shortness of breath, life threatening emergency, suicidal or homicidal thoughts you must seek medical attention immediately by calling 911 or calling your MD immediately  if symptoms less severe.  You Must read complete instructions/literature along with all the possible adverse reactions/side effects for all the Medicines you take and that have been  prescribed to you. Take any new Medicines after you have completely understood and accept all the possible adverse reactions/side effects.   Please note  You were cared for by a hospitalist during your hospital stay. If you have any questions about your discharge medications or the care you received while you were in the hospital after you are discharged, you can call the unit and asked to speak with the hospitalist on call if the hospitalist that took care of you is not available. Once you are discharged, your primary care physician will handle any further medical issues. Please note that NO REFILLS for any discharge medications will be authorized once you are discharged, as it is imperative that you return to your primary care physician (or establish a relationship with a primary care physician if you do not have one) for your aftercare needs so that they can reassess your need for medications and monitor your lab values.    Today   SUBJECTIVE   No complaint.   VITAL SIGNS:  Blood pressure 107/58, pulse 111, temperature 97.9 F (36.6 C), temperature source Oral, resp. rate 18, height 5\' 9"  (1.753 m), weight 69.4 kg (153 lb), SpO2 97 %.  I/O:   Intake/Output Summary (Last 24 hours) at 09/15/15 1025 Last data filed at 09/15/15 0715  Gross per 24 hour  Intake    120 ml  Output    650 ml  Net   -530 ml    PHYSICAL EXAMINATION:  GENERAL:  74 y.o.-year-old patient lying in the bed with no acute distress.  EYES: Pupils equal, round, reactive to light and accommodation. No scleral icterus. Extraocular muscles intact.  HEENT: Head atraumatic, normocephalic. Oropharynx and nasopharynx clear.  NECK:  Supple, no jugular venous distention. No thyroid enlargement, no tenderness.  LUNGS: Normal breath sounds bilaterally, no wheezing, rales,rhonchi or crepitation. No use of accessory muscles of respiration.  CARDIOVASCULAR: S1, S2 normal. No murmurs, rubs, or gallops.  ABDOMEN: Soft,  non-tender, non-distended. Bowel sounds present. No organomegaly or mass.  EXTREMITIES: No pedal edema, cyanosis, or clubbing.  NEUROLOGIC: Cranial nerves II through XII are intact. Muscle strength 4/5 in all extremities. Sensation intact. Gait not checked.  PSYCHIATRIC: [name].  SKIN: No obvious rash, lesion, sacral DU stage 3..   DATA REVIEW:   CBC  Recent Labs Lab 09/15/15 0449  WBC 17.6*  HGB 6.8*  HCT 21.0*  PLT 361    Chemistries   Recent Labs Lab 09/12/15 0943 09/13/15 0430  NA 136 135  K 3.9 3.7  CL 99* 101  CO2 25 23  GLUCOSE 84 80  BUN 23* 21*  CREATININE 1.06 0.96  CALCIUM 9.0 8.5*  AST 198*  --   ALT 76*  --   ALKPHOS 324*  --   BILITOT 3.2*  --     Cardiac Enzymes  Recent Labs Lab 09/12/15 0943  TROPONINI <0.03    Microbiology Results  Results for orders placed or performed during the hospital  encounter of 09/12/15  Culture, blood (routine x 2)     Status: None (Preliminary result)   Collection Time: 09/12/15  2:30 PM  Result Value Ref Range Status   Specimen Description BLOOD LEFT AC  Final   Special Requests BOTTLES DRAWN AEROBIC AND ANAEROBIC  13CC  Final   Culture NO GROWTH 3 DAYS  Final   Report Status PENDING  Incomplete  Culture, blood (routine x 2)     Status: None (Preliminary result)   Collection Time: 09/12/15  2:46 PM  Result Value Ref Range Status   Specimen Description BLOOD LEFT FOREARM  Final   Special Requests   Final    BOTTLES DRAWN AEROBIC AND ANAEROBIC  AER 6CC ANA 10CC   Culture NO GROWTH 3 DAYS  Final   Report Status PENDING  Incomplete    RADIOLOGY:  No results found.      Management plans discussed with the patient, family and they are in agreement.  CODE STATUS:     Code Status Orders        Start     Ordered   09/12/15 1334  Full code   Continuous     09/12/15 1333      TOTAL TIME TAKING CARE OF THIS PATIENT: 37 minutes.    Demetrios Loll M.D on 09/15/2015 at  10:25 AM  Between 7am to 6pm - Pager - [phone]  After 6pm go to [email or web address] - password [email or web address] [facility]  Office  [phone]  CC: Primary care physician; Dion Body, MD

## 2015-09-15 NOTE — Discharge Planning (Signed)
One unit finished, vital signs assessed and another unit of blood has been issued, pt will go to facility after unit of blood and vitals are stable

## 2015-09-15 NOTE — Clinical Social Work Placement (Signed)
   CLINICAL SOCIAL WORK PLACEMENT  NOTE  Date:  09/15/2015  Patient Details  Name: Aaron Allen MRN: [id number] Date of Birth: [birth year]  Clinical Social Work is seeking post-discharge placement for this patient at the [place] level of care (*CSW will initial, date and re-position this form in  chart as items are completed):  Yes   Patient/family provided with [facility] Work Department's list of facilities offering this level of care within the geographic area requested by the patient (or if unable, by the patient's family).  Yes   Patient/family informed of their freedom to choose among providers that offer the needed level of care, that participate in Medicare, Medicaid or managed care program needed by the patient, have an available bed and are willing to accept the patient.  Yes   Patient/family informed of [facility]'s ownership interest in [facility] and [facility], as well as of the fact that they are under no obligation to receive care at these facilities.  PASRR submitted to EDS on 09/14/15     PASRR number received on 09/14/15     Existing PASRR number confirmed on       FL2 transmitted to all facilities in geographic area requested by pt/family on 09/14/15     FL2 transmitted to all facilities within larger geographic area on       Patient informed that his/her managed care company has contracts with or will negotiate with certain facilities, including the following:        Yes   Patient/family informed of bed offers received.  Patient chooses bed at  [facility] )     Physician recommends and patient chooses bed at      Patient to be transferred to  [facility] ) on 09/15/15.  Patient to be transferred to facility by  [facility] )     Patient family notified on 09/15/15 of transfer.  Name of family member notified:   (Patient's daughter Abigail Butts is aware of D/C. )     PHYSICIAN        Additional Comment:    _______________________________________________ Loralyn Freshwater, LCSW 09/15/2015, 1:31 PM

## 2015-09-15 NOTE — Progress Notes (Signed)
Pt alert and oriented, called facility x3 for report, but nobody answered. 2 units of blood given with no noted side effects.

## 2015-09-15 NOTE — Discharge Planning (Signed)
Pt. Getting 2 units of blood then discharge to facility, possible transfer at shift change.

## 2015-09-15 NOTE — Progress Notes (Signed)
Patient is medically stable for D/C to [facility] today after he receives blood transfusion. Per [facility] admissions coordinator at [facility] patient is going to room 502. [employer] authorization has been received. RN will call report to 500 hall and arrange EMS for transport. Clinical Education officer, museum (CSW) prepared D/C packet and sent D/C Summary to [employer] via carefinder. Patient is aware of above. CSW contacted patient's daughter Abigail Butts and made her aware of above. Please reconsult if future social work needs arise. CSW signing off.   Blima Rich, [place] [phone]

## 2015-09-15 NOTE — Plan of Care (Signed)
Problem: Discharge Progression Outcomes Goal: Other Discharge Outcomes/Goals Outcome: Progressing Plan of care progress to goal: Pt trends with sinus tach Blood sugars WDL.  Coverage given one time.

## 2015-09-16 LAB — TYPE AND SCREEN
ABO/RH(D): A NEG
Antibody Screen: NEGATIVE
UNIT DIVISION: 0
UNIT DIVISION: 0

## 2015-09-17 LAB — CULTURE, BLOOD (ROUTINE X 2)
CULTURE: NO GROWTH
CULTURE: NO GROWTH

## 2015-09-18 ENCOUNTER — Other Ambulatory Visit: Payer: Self-pay | Admitting: Hematology and Oncology

## 2015-09-18 MED ORDER — MEGESTROL ACETATE 625 MG/5ML PO SUSP: 625.0000 mg | Freq: Every day | ORAL | Status: DC

## 2015-09-23 ENCOUNTER — Inpatient Hospital Stay: Payer: Medicare PPO

## 2015-09-23 ENCOUNTER — Emergency Department: Payer: Medicare PPO

## 2015-09-23 ENCOUNTER — Emergency Department
Admission: EM | Admit: 2015-09-23 | Discharge: 2015-09-23 | Disposition: A | Discharge status: Home / Self Care | Payer: Medicare PPO | Attending: Student | Admitting: Student

## 2015-09-23 ENCOUNTER — Inpatient Hospital Stay: Payer: Medicare PPO | Admitting: Oncology

## 2015-09-23 DIAGNOSIS — N183 Chronic kidney disease, stage 3 (moderate): Secondary | ICD-10-CM

## 2015-09-23 DIAGNOSIS — C787 Secondary malignant neoplasm of liver and intrahepatic bile duct: Secondary | ICD-10-CM | POA: Diagnosis not present

## 2015-09-23 DIAGNOSIS — C189 Malignant neoplasm of colon, unspecified: Secondary | ICD-10-CM

## 2015-09-23 DIAGNOSIS — R17 Unspecified jaundice: Secondary | ICD-10-CM

## 2015-09-23 DIAGNOSIS — Z87891 Personal history of nicotine dependence: Secondary | ICD-10-CM | POA: Diagnosis not present

## 2015-09-23 DIAGNOSIS — N182 Chronic kidney disease, stage 2 (mild): Secondary | ICD-10-CM | POA: Diagnosis not present

## 2015-09-23 DIAGNOSIS — D649 Anemia, unspecified: Secondary | ICD-10-CM | POA: Insufficient documentation

## 2015-09-23 DIAGNOSIS — I129 Hypertensive chronic kidney disease with stage 1 through stage 4 chronic kidney disease, or unspecified chronic kidney disease: Secondary | ICD-10-CM | POA: Diagnosis not present

## 2015-09-23 DIAGNOSIS — E119 Type 2 diabetes mellitus without complications: Secondary | ICD-10-CM | POA: Insufficient documentation

## 2015-09-23 DIAGNOSIS — L8990 Pressure ulcer of unspecified site, unspecified stage: Secondary | ICD-10-CM

## 2015-09-23 DIAGNOSIS — A419 Sepsis, unspecified organism: Secondary | ICD-10-CM | POA: Diagnosis not present

## 2015-09-23 DIAGNOSIS — I340 Nonrheumatic mitral (valve) insufficiency: Secondary | ICD-10-CM

## 2015-09-23 DIAGNOSIS — I248 Other forms of acute ischemic heart disease: Secondary | ICD-10-CM

## 2015-09-23 DIAGNOSIS — I10 Essential (primary) hypertension: Secondary | ICD-10-CM

## 2015-09-23 DIAGNOSIS — I959 Hypotension, unspecified: Secondary | ICD-10-CM | POA: Insufficient documentation

## 2015-09-23 DIAGNOSIS — D509 Iron deficiency anemia, unspecified: Secondary | ICD-10-CM

## 2015-09-23 DIAGNOSIS — E43 Unspecified severe protein-calorie malnutrition: Secondary | ICD-10-CM

## 2015-09-23 DIAGNOSIS — Z515 Encounter for palliative care: Secondary | ICD-10-CM | POA: Diagnosis not present

## 2015-09-23 DIAGNOSIS — R5383 Other fatigue: Secondary | ICD-10-CM

## 2015-09-23 DIAGNOSIS — G9341 Metabolic encephalopathy: Secondary | ICD-10-CM

## 2015-09-23 DIAGNOSIS — E785 Hyperlipidemia, unspecified: Secondary | ICD-10-CM

## 2015-09-23 DIAGNOSIS — R7989 Other specified abnormal findings of blood chemistry: Secondary | ICD-10-CM

## 2015-09-23 LAB — COMPREHENSIVE METABOLIC PANEL
ALK PHOS: 229 U/L — AB (ref 38–126)
ALT: 70 U/L — ABNORMAL HIGH (ref 17–63)
AST: 188 U/L — ABNORMAL HIGH (ref 15–41)
Albumin: 1.7 g/dL — ABNORMAL LOW (ref 3.5–5.0)
Anion gap: 23 — ABNORMAL HIGH (ref 5–15)
BILIRUBIN TOTAL: 9.3 mg/dL — AB (ref 0.3–1.2)
BUN: 148 mg/dL — ABNORMAL HIGH (ref 6–20)
CALCIUM: 8.7 mg/dL — AB (ref 8.9–10.3)
CO2: 11 mmol/L — AB (ref 22–32)
Chloride: 104 mmol/L (ref 101–111)
Creatinine, Ser: 4.18 mg/dL — ABNORMAL HIGH (ref 0.61–1.24)
GFR, EST AFRICAN AMERICAN: 15 mL/min — AB (ref >60)
GFR, EST NON AFRICAN AMERICAN: 13 mL/min — AB (ref >60)
Glucose, Bld: 149 mg/dL — ABNORMAL HIGH (ref 65–99)
Potassium: 5 mmol/L (ref 3.5–5.1)
SODIUM: 138 mmol/L (ref 135–145)
TOTAL PROTEIN: 5.9 g/dL — AB (ref 6.5–8.1)

## 2015-09-23 LAB — URINALYSIS COMPLETE WITH MICROSCOPIC (ARMC ONLY)
GLUCOSE, UA: 50 mg/dL — AB
KETONES UR: NEGATIVE mg/dL
LEUKOCYTES UA: NEGATIVE
NITRITE: NEGATIVE
PH: 5 (ref 5.0–8.0)
Protein, ur: NEGATIVE mg/dL
Specific Gravity, Urine: 1.017 (ref 1.005–1.030)

## 2015-09-23 LAB — CBC WITH DIFFERENTIAL/PLATELET
BLASTS: 0 %
Band Neutrophils: 3 %
Basophils Absolute: 0 10*3/uL (ref 0–0.1)
Basophils Relative: 0 %
Eosinophils Absolute: 0 10*3/uL (ref 0–0.7)
Eosinophils Relative: 0 %
HEMATOCRIT: 26.7 % — AB (ref 40.0–52.0)
HEMOGLOBIN: 8.2 g/dL — AB (ref 13.0–18.0)
LYMPHS PCT: 3 %
Lymphs Abs: 1.2 10*3/uL (ref 1.0–3.6)
MCH: 23.8 pg — AB (ref 26.0–34.0)
MCHC: 30.8 g/dL — AB (ref 32.0–36.0)
MCV: 77.2 fL — ABNORMAL LOW (ref 80.0–100.0)
Metamyelocytes Relative: 0 %
Monocytes Absolute: 0.4 10*3/uL (ref 0.2–1.0)
Monocytes Relative: 1 %
Myelocytes: 0 %
NEUTROS PCT: 93 %
NRBC: 0 /100{WBCs}
Neutro Abs: 38.3 10*3/uL — ABNORMAL HIGH (ref 1.4–6.5)
Other: 0 %
PROMYELOCYTES ABS: 0 %
Platelets: 288 10*3/uL (ref 150–440)
RBC: 3.46 MIL/uL — AB (ref 4.40–5.90)
RDW: 27.4 % — ABNORMAL HIGH (ref 11.5–14.5)
WBC: 39.9 10*3/uL — AB (ref 3.8–10.6)

## 2015-09-23 LAB — BLOOD GAS, ARTERIAL
ALLENS TEST (PASS/FAIL): POSITIVE — AB
FIO2: 0.21
O2 SAT: 99.7 %
PH ART: 7.4 (ref 7.350–7.450)
Patient temperature: 37
pO2, Arterial: 120 mmHg — ABNORMAL HIGH (ref 83.0–108.0)

## 2015-09-23 LAB — LIPASE, BLOOD: Lipase: 73 U/L — ABNORMAL HIGH (ref 11–51)

## 2015-09-23 LAB — TROPONIN I: Troponin I: 0.05 ng/mL — ABNORMAL HIGH (ref <0.031)

## 2015-09-23 LAB — TYPE AND SCREEN
ABO/RH(D): A NEG
Antibody Screen: NEGATIVE

## 2015-09-23 LAB — BRAIN NATRIURETIC PEPTIDE: B NATRIURETIC PEPTIDE 5: 282 pg/mL — AB (ref 0.0–100.0)

## 2015-09-23 MED ORDER — PIPERACILLIN-TAZOBACTAM 3.375 G IVPB 30 MIN
3.3750 g | Freq: Once | INTRAVENOUS | Status: AC
  Administered 2015-09-23: 3.375 g via INTRAVENOUS
  Filled 2015-09-23: qty 50

## 2015-09-23 MED ORDER — BISACODYL 10 MG RE SUPP
10.0000 mg | Freq: Every day | RECTAL | Status: DC | PRN
  Filled 2015-09-23: qty 1

## 2015-09-23 MED ORDER — SODIUM CHLORIDE 0.9 % IV BOLUS (SEPSIS)
1000.0000 mL | INTRAVENOUS | Status: DC
  Administered 2015-09-23: 1000 mL via INTRAVENOUS

## 2015-09-23 MED ORDER — MORPHINE SULFATE (PF) 4 MG/ML IV SOLN: 3.0000 mg | INTRAVENOUS | Status: DC | PRN

## 2015-09-23 MED ORDER — MORPHINE SULFATE (PF) 2 MG/ML IV SOLN: 2.0000 mg | INTRAVENOUS | Status: DC | PRN

## 2015-09-23 MED ORDER — MORPHINE SULFATE (CONCENTRATE) 10 MG /0.5 ML PO SOLN: 5.0000 mg | ORAL | Status: AC | PRN

## 2015-09-23 MED ORDER — VANCOMYCIN HCL IN DEXTROSE 1-5 GM/200ML-% IV SOLN
1000.0000 mg | Freq: Once | INTRAVENOUS | Status: DC
  Administered 2015-09-23: 1000 mg via INTRAVENOUS
  Filled 2015-09-23: qty 200

## 2015-09-23 MED ORDER — GLYCOPYRROLATE 0.2 MG/ML IJ SOLN
0.1000 mg | Freq: Three times a day (TID) | INTRAMUSCULAR | Status: DC | PRN
  Filled 2015-09-23: qty 0.5

## 2015-09-23 MED ORDER — PROCHLORPERAZINE 25 MG RE SUPP: 25.0000 mg | Freq: Three times a day (TID) | RECTAL | Status: AC | PRN

## 2015-09-23 MED ORDER — ONDANSETRON HCL 4 MG/2ML IJ SOLN: 4.0000 mg | Freq: Four times a day (QID) | INTRAMUSCULAR | Status: DC | PRN

## 2015-09-23 MED ORDER — LORAZEPAM 0.5 MG PO TABS: 0.5000 mg | ORAL_TABLET | ORAL | Status: AC | PRN

## 2015-09-23 MED ORDER — GLYCOPYRROLATE 1 MG PO TABS: 1.0000 mg | ORAL_TABLET | Freq: Three times a day (TID) | ORAL | Status: AC | PRN

## 2015-09-23 MED ORDER — LORAZEPAM 2 MG/ML IJ SOLN: 0.5000 mg | INTRAMUSCULAR | Status: DC | PRN

## 2015-09-23 MED ORDER — SODIUM CHLORIDE 0.9 % IV BOLUS (SEPSIS)
500.0000 mL | INTRAVENOUS | Status: DC
  Administered 2015-09-23: 500 mL via INTRAVENOUS

## 2015-09-23 MED ORDER — BISACODYL 10 MG RE SUPP: 10.0000 mg | Freq: Every day | RECTAL | Status: AC | PRN

## 2015-09-23 NOTE — Progress Notes (Signed)
   09/23/15 1511  Clinical Encounter Type  Visited With Patient and family together  Visit Type Initial;Spiritual support  Referral From Nurse  Consult/Referral To Chaplain  Spiritual Encounters  Spiritual Needs Prayer;Emotional;Grief support  Advance Directives (For Healthcare)  Does patient have an advance directive? No  Would patient like information on creating an advanced directive? No - patient declined information  Chaplain visited with patient and family and provided pastoral care.   Chaplain Aerilynn Goin [phone]

## 2015-09-23 NOTE — ED Provider Notes (Signed)
[facility] Emergency Department Provider Note     Time seen: ----------------------------------------- 2:35 PM on 09/23/2015 -----------------------------------------  Level V caveat: Review of systems and history is limited by altered mental status and weakness  I have reviewed the triage vital signs and the nursing notes.   HISTORY  Chief Complaint Code Sepsis    HPI Aaron Allen is a 74 y.o. male brought to the ER from nursing home for hypotension and lab abnormalities. According to report he recently had some lab work which showed a markedly elevated white blood cell count and low hemoglobin. 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 Recently hospice was to be involved, but this point he is still a full code.   Past Medical History  Diagnosis Date  . Diabetes mellitus without complication ([place])   . Hypertension   . HLD (hyperlipidemia)   . Arthritis   . Moderate mitral regurgitation     a. not noted on echo 08/2015 (EF 55-60%, Gr 1 DD, Ao sclerosis, mod-sev dil LA).  . Complication of anesthesia   . CKD II - III   . Metastatic colon cancer to liver ([place])     a. 08/13/2015 s/p lap R colectomy.  . Iron deficiency anemia     a. In setting of metastatic colon CA.    Patient Active Problem List   Diagnosis Date Noted  . Pressure ulcer 09/13/2015  . Malnutrition of moderate degree 09/13/2015  . Hypoglycemia 09/12/2015  . Elevated troponin 08/18/2015  . Diabetes mellitus without complication ([place])   . HLD (hyperlipidemia)   . Moderate mitral regurgitation   . CKD (chronic kidney disease), stage III   . Metastatic colon cancer to liver ([place])   . Cancer of right colon ([place]) 08/13/2015  . Anemia, iron deficiency 06/12/2015  . Leukocytosis 06/12/2015  . Generalized weakness 06/12/2015  . Anemia 06/11/2015  . Diabetes mellitus type 2 in nonobese ([place]) 06/11/2015  . Hypertension 06/11/2015     Past Surgical History  Procedure Laterality Date  . Colonoscopy with propofol N/A 07/19/2015    Procedure: COLONOSCOPY WITH PROPOFOL;  Surgeon: Josefine Class, MD;  Location: [facility] ENDOSCOPY;  Service: Endoscopy;  Laterality: N/A;  . Esophagogastroduodenoscopy (egd) with propofol N/A 07/19/2015    Procedure: ESOPHAGOGASTRODUODENOSCOPY (EGD) WITH PROPOFOL;  Surgeon: Josefine Class, MD;  Location: [facility] ENDOSCOPY;  Service: Endoscopy;  Laterality: N/A;  . Laparoscopic right colectomy N/A 08/13/2015    Procedure: LAPAROSCOPIC RIGHT COLECTOMY;  Surgeon: Leonie Green, MD;  Location: [facility] ORS;  Service: General;  Laterality: N/A;  . Laparotomy N/A 08/24/2015    Procedure: EXPLORATORY LAPAROTOMY, ENTEROLYSIS OF ADHESIONS, ENTEROTOMY FOR DECOMPRESSION;  Surgeon: Christene Lye, MD;  Location: [facility] ORS;  Service: General;  Laterality: N/A;    Allergies Review of patient's allergies indicates no known allergies.  Social History Social History  Substance Use Topics  . Smoking status: Former Smoker    Quit date: 08/04/1966  . Smokeless tobacco: Current User    Types: Chew  . Alcohol Use: No    Review of Systems Constitutional: Negative for fever. Respiratory: Negative for shortness of breath. Gastrointestinal: Positive for coffee-ground emesis Skin: Positive for pallor Neurological:  positive for weakness  review of systems is otherwise unknown  ____________________________________________   PHYSICAL EXAM:  VITAL SIGNS: ED Triage Vitals  Enc Vitals Group     BP 09/23/15 1432 75/53 mmHg     Pulse Rate 09/23/15 1432  88     Resp 09/23/15 1432 26     Temp 09/23/15 1432 96.9 F (36.1 C)     Temp Source 09/23/15 1432 Rectal     SpO2 09/23/15 1432 100 %     Weight 09/23/15 1432 154 lb 5.2 oz (70 kg)     Height --      Head Cir --      Peak Flow --      Pain Score --      Pain Loc --      Pain Edu? --      Excl. in [place]? --     Constitutional: Alert but  disoriented. Critically ill-appearing in mild to moderate distress Eyes: Scleral icterus bilaterally PERRL. Normal extraocular movements. ENT   Head: Normocephalic and atraumatic.   Nose: No congestion/rhinnorhea.   Mouth/Throat: Mucous membranes are very dry   Neck: No stridor. Cardiovascular: Normal rate, regular rhythm. Poor peripheral pulses diffusely Respiratory: Patient with rapid, deep respirations Gastrointestinal: Abdomen is firm and tender, distended. Likely masses felt on examination Musculoskeletal: Nontender with normal range of motion in all extremities. Bilateral lower extremity edema. Neurologic: Markedly diffuse weakness, patient whispers to speak Skin:  Skin pallor is noted, skin is very dry with poor skin turgor ____________________________________________  EKG: Interpreted by me. Normal sinus rhythm with rate of 85 bpm, normal PR interval, normal QS with, borderline long QT interval. No evidence of acute infarction. Likely septal infarct ____________________________________________  ED COURSE:  Pertinent labs & imaging results that were available during my care of the patient were reviewed by me and considered in my medical decision making (see chart for details). Patient is critically ill, likely septic. We'll begin septic protocols ____________________________________________    LABS (pertinent positives/negatives)  Labs Reviewed  CBC WITH DIFFERENTIAL/PLATELET - Abnormal; Notable for the following:    WBC 39.9 (*)    RBC 3.46 (*)    Hemoglobin 8.2 (*)    HCT 26.7 (*)    MCV 77.2 (*)    MCH 23.8 (*)    MCHC 30.8 (*)    RDW 27.4 (*)    Neutro Abs 38.3 (*)    All other components within normal limits  URINALYSIS COMPLETEWITH MICROSCOPIC ([facility] ONLY) - Abnormal; Notable for the following:    Color, Urine AMBER (*)    APPearance CLOUDY (*)    Glucose, UA 50 (*)    Bilirubin Urine 1+ (*)    Hgb urine dipstick 1+ (*)    Bacteria, UA RARE (*)     Squamous Epithelial / LPF 0-5 (*)    All other components within normal limits  BLOOD GAS, ARTERIAL - Abnormal; Notable for the following:    pCO2 arterial <19 (*)    pO2, Arterial 120 (*)    Allens test (pass/fail) POSITIVE (*)    All other components within normal limits  CULTURE, BLOOD (ROUTINE X 2)  CULTURE, BLOOD (ROUTINE X 2)  URINE CULTURE  COMPREHENSIVE METABOLIC PANEL  TROPONIN I  BRAIN NATRIURETIC PEPTIDE  LIPASE, BLOOD  TYPE AND SCREEN  TYPE AND SCREEN   ____________________________________________  FINAL ASSESSMENT AND PLAN  Hypotension, terminal cancer, sepsis  Plan: Patient with labs as dictated above. Decision was made with family present to make him comfort measures, DO NOT RESUSCITATE. He is currently unstable with low blood pressure. Patient remains with abnormal breathing pattern and critical ill appearance. Patient will be sent to the hospice home or admitted here for comfort measures. Earleen Newport, MD  Earleen Newport, MD 09/23/15 [phone]

## 2015-09-23 NOTE — Consult Note (Addendum)
Palliative Medicine Inpatient Consult Note   Name: Aaron Allen Date: 09/23/2015 MRN: [id number]  DOB: 24-Apr-1941  Referring Physician: Joanne Gavel, MD  Palliative Care consult requested for this 74 y.o. male for goals of medical therapy in patient with septic shock and metastatic colon cancer with a right laparoscopic colectomy performed on Sept 9, 2016.  Pt has been seen by Dr Shari Heritage, oncologist, and family says that he was waiting to see if pt would get stronger before starting chemotherapy.   I was asked to see the patient by the ED physician, Dr Jimmye Norman, since early conversations he had had with family indicated that they all might be leaning in more of a comfort direction than the full aggressive direction. The patient had been at home living independently --but with a lot of support from his children, until a recent hospital stay from Oct 9 - 12, 2016.  After that, he was discharged to [facility] for short term rehab, but family says he did not do well.  He has been very weak for about a weak's time; eating less and starting to moan at times.  He is known to have mets to his liver and he presents quite jaundiced today.  His presenting bp was 70 systolic and he was started initially on a sepsis protocol.  I came to see pt before he was admitted, to clarify goals and disposition that would be best for pt.   TODAY'S DISCUSSIONS AND DECISIONS: Pt came here with a full code status.  The situation looked quite grim for the pt and the ED physician hoped that I could bring up palliative options for the patient.  Pt has four adult children who all decide together what to do for or about their father.  However, they apparently defer somewhat to the oldest son, Aaron Allen.  I spoke by phone with Aaron Allen, since he is a son and yet he himself could not make it here to discuss options in person.  He agreed readily with DNR, and then on the question of comfort care, he said whatever his siblings  agreed upon would be fine with him. Aaron Allen is a daughter who is on her way, but not here yet.  I was able to speak to Aaron Allen and Aaron Allen in person in the ED.  They agreed with DNR and also comfort care.  They also agreed that they would like to consider Hospice Home once I presented various.options for comfort care settings.  Pts sister, Aaron Allen, is here, and she used to volunteer at [facility], so was receptive to this plan.  Pt has had a white blood cell count return at 40, 000 with UA negative for typical signs of infection, although there were WBC clumps.  His bl;ood gas showed a PC)2 of only 19 with a CO2 on chemistry lab of only 11.  Creatinine is 4.18 with EGFR of 15.  His kidney function was normal with Cr of 1.06 11 days ago.  I have talked to the pt who is still somewhat responsive. He agrees with going to [facility].  I am not sure what he understood, but he seemed to know we are transferring him to a place where they will care for him.  Family had asked me to try to talk with him.  He mumbles in response and it is hard to tell what he comprehends.  The daughter, Aaron Allen, has since arrived, and she is also in agreement with Hospice Home.  Pt has DNR portable form completed as well as new DC meds for his transfer to [place].  ER physician is aware.    Hospice Liaison nurse is here making arranagements for pts transfer.    Troponin is only mildly elevated and is thought to be demand ischemia.  Iron is low.  Albumin is 1.7 indicating severe malnutrition.  Hgb is 8.2     IMPRESSION: 1.  Metestatic colon cancer ---s/p resection in Sept ---with mets to liver and high liver function tests and jaundice ---with very poor functional/ performance status (getting weaker and weaker) 2. Septic Shock  ---present on admission ---source not known but suspected to be colon (leaky bowel?) 3.  Metabolic Encephalopathy 4.  Pressure Ulcer 5.  Hypoglycemia 6.  DM2 7. Dyslipidemia 8.   Severe Malnutrition 9. Essential HTN 10. Iron Deficiency Anemia 11. Mod mitral Valve  1    REVIEW OF SYSTEMS:  Patient is not able to provide ROS due to confusion and critical illness  SPIRITUAL SUPPORT SYSTEM: Yes  --family (4 adult caring children).  SOCIAL HISTORY:  reports that he quit smoking about 49 years ago. His smokeless tobacco use includes Chew. He reports that he does not drink alcohol or use illicit drugs. Pt was in the hospital 10/9-12 and after that, he went to [facility].  All four children are co-deciders for pt.  Pt previously lived in his own home independently but family was very involved.    LEGAL DOCUMENTS:  none  CODE STATUS: DNR  --as of now  PAST MEDICAL HISTORY: Past Medical History  Diagnosis Date  . Diabetes mellitus without complication ([place])   . Hypertension   . HLD (hyperlipidemia)   . Arthritis   . Moderate mitral regurgitation     a. not noted on echo 08/2015 (EF 55-60%, Gr 1 DD, Ao sclerosis, mod-sev dil LA).  . Complication of anesthesia   . CKD II - III   . Metastatic colon cancer to liver ([place])     a. 08/13/2015 s/p lap R colectomy.  . Iron deficiency anemia     a. In setting of metastatic colon CA.    PAST SURGICAL HISTORY:  Past Surgical History  Procedure Laterality Date  . Colonoscopy with propofol N/A 07/19/2015    Procedure: COLONOSCOPY WITH PROPOFOL;  Surgeon: Josefine Class, MD;  Location: [facility] ENDOSCOPY;  Service: Endoscopy;  Laterality: N/A;  . Esophagogastroduodenoscopy (egd) with propofol N/A 07/19/2015    Procedure: ESOPHAGOGASTRODUODENOSCOPY (EGD) WITH PROPOFOL;  Surgeon: Josefine Class, MD;  Location: [facility] ENDOSCOPY;  Service: Endoscopy;  Laterality: N/A;  . Laparoscopic right colectomy N/A 08/13/2015    Procedure: LAPAROSCOPIC RIGHT COLECTOMY;  Surgeon: Leonie Green, MD;  Location: [facility] ORS;  Service: General;  Laterality: N/A;  . Laparotomy N/A 08/24/2015    Procedure: EXPLORATORY LAPAROTOMY,  ENTEROLYSIS OF ADHESIONS, ENTEROTOMY FOR DECOMPRESSION;  Surgeon: Christene Lye, MD;  Location: [facility] ORS;  Service: General;  Laterality: N/A;    ALLERGIES:  has No Known Allergies.  MEDICATIONS: I HAVE CHANGED HIS MEDS TO THE FOLLOWING while here in ER: Current Facility-Administered Medications  Medication Dose Route Frequency Provider Last Rate Last Dose  . bisacodyl (DULCOLAX) suppository 10 mg  10 mg Rectal Daily PRN Colleen Can, MD      . glycopyrrolate (ROBINUL) injection 0.1 mg  0.1 mg Intravenous TID PRN Colleen Can, MD      . LORazepam (ATIVAN) injection 0.5 mg  0.5 mg Intravenous Q4H PRN Joycelyn Schmid  Ples Specter, MD      . morphine 2 MG/ML injection 2 mg  2 mg Intravenous Q1H PRN Colleen Can, MD      . morphine 4 MG/ML injection 3 mg  3 mg Intravenous Q1H PRN Colleen Can, MD      . ondansetron [facility]) injection 4 mg  4 mg Intravenous Q6H PRN Colleen Can, MD       Current Outpatient Prescriptions  Medication Sig Dispense Refill  . ferrous sulfate 325 (65 FE) MG tablet Take 1 tablet (325 mg total) by mouth 3 (three) times daily with meals. (Patient taking differently: Take 325 mg by mouth 2 (two) times daily with a meal. ) 90 tablet 0  . megestrol (MEGACE ES) 625 MG/5ML suspension Take 5 mLs (625 mg total) by mouth daily. (Patient taking differently: Take 1,050 mg by mouth daily. ) 150 mL 2  . metFORMIN (GLUCOPHAGE) 500 MG tablet Take 1 tablet (500 mg total) by mouth 2 (two) times daily with a meal. 30 tablet 0  . NIFEdipine (PROCARDIA XL/ADALAT-CC) 90 MG 24 hr tablet Take 90 mg by mouth daily.    Marland Kitchen oxyCODONE-acetaminophen (PERCOCET/ROXICET) 5-325 MG tablet Take 1 tablet by mouth every 4 (four) hours as needed.  0  . pantoprazole (PROTONIX) 40 MG tablet Take 1 tablet (40 mg total) by mouth 2 (two) times daily. Switch for any other PPI at similar dose and frequency (Patient taking differently: Take 40 mg by mouth 2 (two) times daily. ) 30  tablet 3  . pravastatin (PRAVACHOL) 40 MG tablet Take 40 mg by mouth at bedtime.     . quinapril (ACCUPRIL) 20 MG tablet Take 20 mg by mouth at bedtime.     . bisacodyl (DULCOLAX) 10 MG suppository Place 1 suppository (10 mg total) rectally daily as needed for moderate constipation. 12 suppository 0  . glycopyrrolate (ROBINUL) 1 MG tablet Take 1 tablet (1 mg total) by mouth 3 (three) times daily as needed (excessive secretions). 12 tablet 0  . LORazepam (ATIVAN) 0.5 MG tablet Take 1 tablet (0.5 mg total) by mouth every 4 (four) hours as needed for anxiety. 12 tablet 0  . Morphine Sulfate (MORPHINE CONCENTRATE) 10 mg / 0.5 ml concentrated solution Take 0.25 mLs (5 mg total) by mouth every 2 (two) hours as needed for severe pain (pain or shortness of breath). 30 mL 0  . prochlorperazine (COMPAZINE) 25 MG suppository Place 1 suppository (25 mg total) rectally every 8 (eight) hours as needed for nausea or vomiting. 6 suppository 0    Vital Signs: BP 81/50 mmHg  Pulse 88  Temp(Src) 96.9 F (36.1 C) (Rectal)  Resp 19  Wt 70 kg (154 lb 5.2 oz)  SpO2 100% Filed Weights   09/23/15 1432  Weight: 70 kg (154 lb 5.2 oz)    Estimated body mass index is 22.78 kg/(m^2) as calculated from the following:   Height as of 09/12/15: 5' 9"  (1.753 m).   Weight as of this encounter: 70 kg (154 lb 5.2 oz).  PERFORMANCE STATUS (ECOG) : 4 - Bedbound  PHYSICAL EXAM: Moderate distress with wheezing, labored respirations, and occasional moaning Jaundiced with scleral icterus Nares patent but dry mucosa OP dry  Neck with no JVD or Tm Heart rrr tachycardic Lungs with decreased BS in bases Abd with BS and nontender Ext jaundiced but w/o mottling or cyanosis.  LABS: CBC:    Component Value Date/Time   WBC 39.9* 09/23/2015 1445   HGB 8.2* 09/23/2015  1445   HCT 26.7* 09/23/2015 1445   PLT 288 09/23/2015 1445   MCV 77.2* 09/23/2015 1445   NEUTROABS 38.3* 09/23/2015 1445   LYMPHSABS 1.2 09/23/2015 1445    MONOABS 0.4 09/23/2015 1445   EOSABS 0.0 09/23/2015 1445   BASOSABS 0.0 09/23/2015 1445   Comprehensive Metabolic Panel:    Component Value Date/Time   NA 138 09/23/2015 1445   K 5.0 09/23/2015 1445   CL 104 09/23/2015 1445   CO2 11* 09/23/2015 1445   BUN 148* 09/23/2015 1445   CREATININE 4.18* 09/23/2015 1445   GLUCOSE 149* 09/23/2015 1445   CALCIUM 8.7* 09/23/2015 1445   AST 188* 09/23/2015 1445   ALT 70* 09/23/2015 1445   ALKPHOS 229* 09/23/2015 1445   BILITOT 9.3* 09/23/2015 1445   PROT 5.9* 09/23/2015 1445   ALBUMIN 1.7* 09/23/2015 1445    REFERRALS TO BE ORDERED:  Hospice for Hospice Home   More than 50% of the visit was spent in counseling/coordination of care: Yes  Time Spent: 80 minutes   Addendum:  These are the medications pt will be discharged from the ER with: Roxanol 5 mg po q 4 hrs prn pain or shortness of breath Ativan 0.5 mg po q 4 hr prn anxiety or agitation Dulcolax supp prn constipation QD Robinul 0.4 mg po tid prn excessive secretions Compazine prn.

## 2015-09-23 NOTE — Progress Notes (Signed)
New hospice home referral received from Palliative Care physician Dr. Megan Salon. Patient to the [facility] ED from [facility] where he had been for the last week for rehab. Recently diagnosed with metastatic colon cancer 07/2015, plan was for chemo after rehab but patient has continued to do poorly with decreased appetite and increased weakness.Code sepsis was called d/t hypotension, leukocytosis and decreased responsiveness.  After the conversation with Dr. Megan Salon the patient's children feel that transferring patient to the hospice home for comfort and end of life care was the best choice for their father. He is now a DNR code. Patient seen lying on the ED stretcher, opened his eyes to voice/name, speech difficult to understand, he appeared to be hallucinating at times. Writer met in the room with his daughters Ruthine Dose and son Mariella Saa. Education regarding hospice services, philosophy and team approach to care initiated, questions answered. Consents signed and faxed with patient information to the hospice home. Staff RN Coralyn Mark and attending ED physician Dr. Madaline Savage notified of plan for transport via EMS with portable DNR to the hospice home. Report called to the hospice home, EMS to be notified for pick up by unit secretary. Emotional support offered, hospital chaplain present through out visit. Thank you for the opportunity to be involved in the care of this patient and family. Flo Shanks RN, BSN, [place] and [facility] liaison [phone] c

## 2015-09-23 NOTE — ED Provider Notes (Addendum)
-----------------------------------------   5:14 PM on 09/23/2015 -----------------------------------------  Patient has been evaluated by hospice nurse and will be transferred to hospice home this evening. He is complete comfort care/DNR.  Joanne Gavel, MD 09/23/15 Monson Dhanush Jokerst, MD 09/23/15 [phone]

## 2015-09-23 NOTE — ED Notes (Signed)
Patient put on comfort measures. Family at bedside. Fluids stopped. Patient taken off monitor.

## 2015-09-23 NOTE — ED Notes (Signed)
Pt here from [facility] for abnormal labs and hypotension. Pt responsive to pain upon arrival, rapid respirations.

## 2015-09-23 NOTE — ED Notes (Addendum)
pallitive care MD in with family at the bedside. Will alert x-ray when done. Respiratory in to draw ABG and extra tubes for labs. Patient seems a bit more responsive. Is aware of family members at bedside and able to respond yes and no to some questions.

## 2015-09-23 NOTE — ED Notes (Signed)
EMS here to transport patient to [place]. Family at bedside. Patient tolerated well.

## 2015-09-24 DIAGNOSIS — C189 Malignant neoplasm of colon, unspecified: Secondary | ICD-10-CM

## 2015-09-25 LAB — URINE CULTURE: CULTURE: NO GROWTH

## 2015-09-28 LAB — CULTURE, BLOOD (ROUTINE X 2)
CULTURE: NO GROWTH
Culture: NO GROWTH

## 2015-10-05 NOTE — Progress Notes (Signed)
[place]  [phone] [phone]) [phone]  ID: Aaron Allen OB: 08-May-1941  MR#: [id number]  WNI#:[id number]  Patient Care Team: Dion Body, MD as PCP - General (Family Medicine) Clent Jacks, RN as Registered Nurse  CHIEF COMPLAINT:  Chief Complaint  Patient presents with  . Fatigue    INTERVAL HISTORY:  Patient returns to clinic today for further evaluation in hospital follow-up. He has increased weakness and fatigue and is still recovering from his surgery, but otherwise feels well. He continues to have mild incisional abdominal pain. He has no neurologic complaints. He denies any fevers or weight loss. He denies any chest pain. His shortness of breath has resolved. He denies any nausea, vomiting, constipation, or diarrhea. Patient offers no further specific complaints today.  REVIEW OF SYSTEMS:   Review of Systems  Constitutional: Positive for malaise/fatigue. Negative for fever.  Respiratory: Negative for shortness of breath.   Cardiovascular: Negative.  Negative for chest pain.  Gastrointestinal: Positive for abdominal pain. Negative for blood in stool and melena.  Neurological: Positive for weakness.    As per HPI. Otherwise, a complete review of systems is negatve.  PAST MEDICAL HISTORY: Past Medical History  Diagnosis Date  . Diabetes mellitus without complication ([place])   . Hypertension   . HLD (hyperlipidemia)   . Arthritis   . Moderate mitral regurgitation     a. not noted on echo 08/2015 (EF 55-60%, Gr 1 DD, Ao sclerosis, mod-sev dil LA).  . Complication of anesthesia   . CKD II - III   . Metastatic colon cancer to liver ([place])     a. 08/13/2015 s/p lap R colectomy.  . Iron deficiency anemia     a. In setting of metastatic colon CA.    PAST SURGICAL HISTORY: Past Surgical History  Procedure Laterality Date  . Colonoscopy with propofol N/A 07/19/2015    Procedure: COLONOSCOPY WITH PROPOFOL;  Surgeon: Josefine Class, MD;  Location: [facility] ENDOSCOPY;  Service: Endoscopy;  Laterality: N/A;  . Esophagogastroduodenoscopy (egd) with propofol N/A 07/19/2015    Procedure: ESOPHAGOGASTRODUODENOSCOPY (EGD) WITH PROPOFOL;  Surgeon: Josefine Class, MD;  Location: [facility] ENDOSCOPY;  Service: Endoscopy;  Laterality: N/A;  . Laparoscopic right colectomy N/A 08/13/2015    Procedure: LAPAROSCOPIC RIGHT COLECTOMY;  Surgeon: Leonie Green, MD;  Location: [facility] ORS;  Service: General;  Laterality: N/A;  . Laparotomy N/A 08/24/2015    Procedure: EXPLORATORY LAPAROTOMY, ENTEROLYSIS OF ADHESIONS, ENTEROTOMY FOR DECOMPRESSION;  Surgeon: Christene Lye, MD;  Location: [facility] ORS;  Service: General;  Laterality: N/A;    FAMILY HISTORY Family History  Problem Relation Age of Onset  . Stroke Father   . Lung cancer Father        ADVANCED DIRECTIVES:    HEALTH MAINTENANCE: Social History  Substance Use Topics  . Smoking status: Former Smoker    Quit date: 08/04/1966  . Smokeless tobacco: Current User    Types: Chew  . Alcohol Use: No     Colonoscopy:  PAP:  Bone density:  Lipid panel:  No Known Allergies  Current Outpatient Prescriptions  Medication Sig Dispense Refill  . bisacodyl (DULCOLAX) 10 MG suppository Place 1 suppository (10 mg total) rectally daily as needed for moderate constipation. 12 suppository 0  . glycopyrrolate (ROBINUL) 1 MG tablet Take 1 tablet (1 mg total) by mouth 3 (three) times daily as needed (excessive secretions). 12 tablet 0  . LORazepam (ATIVAN) 0.5 MG tablet Take 1 tablet (0.5 mg total)  by mouth every 4 (four) hours as needed for anxiety. 12 tablet 0  . Morphine Sulfate (MORPHINE CONCENTRATE) 10 mg / 0.5 ml concentrated solution Take 0.25 mLs (5 mg total) by mouth every 2 (two) hours as needed for severe pain (pain or shortness of breath). 30 mL 0  . prochlorperazine (COMPAZINE) 25 MG suppository Place 1 suppository (25 mg total) rectally every 8 (eight) hours as needed  for nausea or vomiting. 6 suppository 0   No current facility-administered medications for this visit.    OBJECTIVE: Filed Vitals:   09/09/15 1103  BP: 107/63  Pulse: 114  Temp: 95.8 F (35.4 C)     Body mass index is 22.88 kg/(m^2).    ECOG FS:2 - Symptomatic, <50% confined to bed  General: Well-developed, well-nourished, no acute distress. Eyes: Pink conjunctiva, anicteric sclera. Lungs: Clear to auscultation bilaterally. Heart: Regular rate and rhythm. No rubs, murmurs, or gallops. Abdomen: Well healing surgical scar, normoactive bowel sounds. Musculoskeletal: No edema, cyanosis, or clubbing. Neuro: Alert, answering all questions appropriately. Cranial nerves grossly intact. Skin: No rashes or petechiae noted. Psych: Normal affect.   LAB RESULTS:  Lab Results  Component Value Date   NA 138 09/23/2015   K 5.0 09/23/2015   CL 104 09/23/2015   CO2 11* 09/23/2015   GLUCOSE 149* 09/23/2015   BUN 148* 09/23/2015   CREATININE 4.18* 09/23/2015   CALCIUM 8.7* 09/23/2015   PROT 5.9* 09/23/2015   ALBUMIN 1.7* 09/23/2015   AST 188* 09/23/2015   ALT 70* 09/23/2015   ALKPHOS 229* 09/23/2015   BILITOT 9.3* 09/23/2015   GFRNONAA 13* 09/23/2015   GFRAA 15* 09/23/2015    Lab Results  Component Value Date   WBC 39.9* 09/23/2015   NEUTROABS 38.3* 09/23/2015   HGB 8.2* 09/23/2015   HCT 26.7* 09/23/2015   MCV 77.2* 09/23/2015   PLT 288 09/23/2015     STUDIES: Dg Chest 1 View  08/26/2015  CLINICAL DATA:  Status post laparoscopic colectomy yesterday. EXAM: CHEST 1 VIEW COMPARISON:  Chest CTA dated 08/17/2015. FINDINGS: Poor inspiration. Mild bibasilar atelectasis. Borderline enlarged cardiac silhouette. Free peritoneal air. Unremarkable bones. IMPRESSION: 1. Moderate amount of free peritoneal air, compatible with the history of colectomy yesterday. 2. Poor inspiration with mild bibasilar atelectasis. Electronically Signed   By: Claudie Revering M.D.   On: 08/26/2015 15:20   Ct  Head Wo Contrast  09/12/2015  CLINICAL DATA:  74 year old found unresponsive lying in the floor at home earlier today with blood glucose of 33. Patient responded to an amp of D50 IV. Patient struck the right side of the forehead when he fell sustaining a small hematoma. Two weeks status post resection of colon cancer. Initial encounter. EXAM: CT HEAD WITHOUT CONTRAST TECHNIQUE: Contiguous axial images were obtained from the base of the skull through the vertex without intravenous contrast. COMPARISON:  None. FINDINGS: Ventricular system normal in size and appearance for age. Mild age-appropriate cortical atrophy. Mega cisterna magna, an anatomic variant. No mass lesion. No midline shift. No acute hemorrhage or hematoma. No extra-axial fluid collections. No evidence of acute infarction. Very small right frontal scalp hematoma without underlying skull fracture. Visualized paranasal sinuses, bilateral mastoid air cells and bilateral middle ear cavities well-aerated. Approximate 1.3 cm sebaceous cyst in the right frontal scalp near the midline. Moderate bilateral carotid siphon atherosclerosis. IMPRESSION: 1. No acute intracranial abnormality. 2. Mild age-appropriate cortical atrophy. 3. Very small right frontal scalp hematoma without underlying skull fracture. Electronically Signed   By: Marcello Moores  Lawrence M.D.   On: 09/12/2015 10:40   US Abdomen Complete  09/13/2015  CLINICAL DATA:  Elevated liver function tests. Current history of colon cancer. EXAM: ULTRASOUND ABDOMEN COMPLETE COMPARISON:  CT scan of July 27, 2015. FINDINGS: Gallbladder: Gallbladder is contracted. No gallstones or wall thickening visualized. No sonographic Murphy sign noted. Common bile duct: Diameter: 3.7 mm which is within normal limits. Liver: Multiple rounded hyperdense foci are noted consistent with metastatic disease as described on prior exam. IVC: No abnormality visualized. Pancreas: Visualized portion unremarkable. Spleen: Size and  appearance within normal limits. Right Kidney: Length: 11 cm. Echogenicity within normal limits. No mass or hydronephrosis visualized. Left Kidney: Length: 10 cm. Echogenicity within normal limits. No mass or hydronephrosis visualized. Abdominal aorta: No aneurysm visualized. Other findings: Minimal fluid is noted around the liver. IMPRESSION: Findings consistent with multiple hepatic metastases. No biliary dilatation or cholelithiasis is noted. Electronically Signed   By: Marijo Conception, M.D.   On: 09/13/2015 10:16   Dg Chest Port 1 View  09/12/2015  CLINICAL DATA:  Elevated white blood count. EXAM: PORTABLE CHEST 1 VIEW COMPARISON:  August 26, 2015. FINDINGS: The heart size and mediastinal contours are within normal limits. No pneumothorax or pleural effusion is noted. Minimal bibasilar subsegmental atelectasis is noted. The visualized skeletal structures are unremarkable. IMPRESSION: Minimal bibasilar subsegmental atelectasis. Electronically Signed   By: Marijo Conception, M.D.   On: 09/12/2015 10:46   US Abdomen Limited Ruq  09/12/2015  CLINICAL DATA:  Abnormal liver function tests. EXAM: US ABDOMEN LIMITED - RIGHT UPPER QUADRANT COMPARISON:  CT of the abdomen pelvis dated 07/27/2015 FINDINGS: Gallbladder: The patient is nonfasting. The gallbladder is incompletely visualized due to its partially collapsed state. The gallbladder wall is thickened to 4.7 mm, which may be artifactual. The sonographic Percell Miller sign is negative. Common bile duct: Diameter: 3.4 mm. Liver: The liver demonstrate diffusely heterogeneous echogenicity, likely due to known hepatic metastatic disease. There is small amount of free fluid within the abdomen. IMPRESSION: Gallbladder wall thickening may be due to incomplete distention, as patient was not fasting at the time of the exam. Repeated imaging of the gallbladder after 8-10 hours of fasting may be considered, if acute cholecystitis is suspected. Normal appearance of the common  bile duct. Diffusely heterogeneous echogenicity of the liver, likely due to known metastatic disease. Small amount of abdominal ascites. Electronically Signed   By: Fidela Salisbury M.D.   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  PLAN:    1. Colon cancer: Patient recently admitted the hospital to have surgery for his near obstructing colon mass. CT scans reviewed independently revealing widespread metastatic disease. Patient is still healing from his surgery, therefore will continue to delay chemotherapy for several more weeks. Return to clinic in 2 weeks with repeat laboratory work and further evaluation. Of note, patient is positive for the K-ras mutation, therefore cetuximab would not be of any benefit. 2. 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 Received 2 infusions of IV Feraheme several weeks ago.   Approximately 30 minutes was spent in discussion and consultation.   Patient expressed understanding and was in agreement with this plan. He also understands that He can call clinic at any time with any questions, concerns, or complaints.    Lloyd Huger, MD   2015/10/23 5:54 PM

## 2015-10-05 NOTE — Progress Notes (Signed)
Patient was scheduled to have an appointment at the cancer center on October 20.  I was going to meet with patient and his family to follow up from discussion on September 09, 2015 regarding the NSABP MPR-1 research study.  I reviewed chart and read patient came to emergency room on October 20 and was transferred to Hospice home and palced on comfort measures.  Patient no longer qualifies for study as one of the eligibility criteria is life expectancy of greater than or less than 6 months.  No further intervention warranted from research.

## 2015-10-05 DEATH — deceased

## 2015-12-02 ENCOUNTER — Other Ambulatory Visit: Payer: Self-pay | Admitting: Nurse Practitioner

## 2016-12-23 IMAGING — CT CT HEAD W/O CM
1 series · 15 of 30 positions shown, 19 images · non-contrast
Comparison: None.

CLINICAL DATA: 74-year-old found unresponsive lying in the floor at
home earlier today with blood glucose of 33. Patient responded to an
amp of D50 IV. Patient struck the right side of the forehead when he
fell sustaining a small hematoma. Two weeks status post resection of
colon cancer. Initial encounter.

EXAM:
CT HEAD WITHOUT CONTRAST
TECHNIQUE: Contiguous axial images were obtained from the base of the skull
through the vertex without intravenous contrast.

[Series 2: head wo · axial · 0.45mm/px · z∈[-153,-27]mm · 15 of 32 slices shown, 19 images]
[im 2/32  brain]
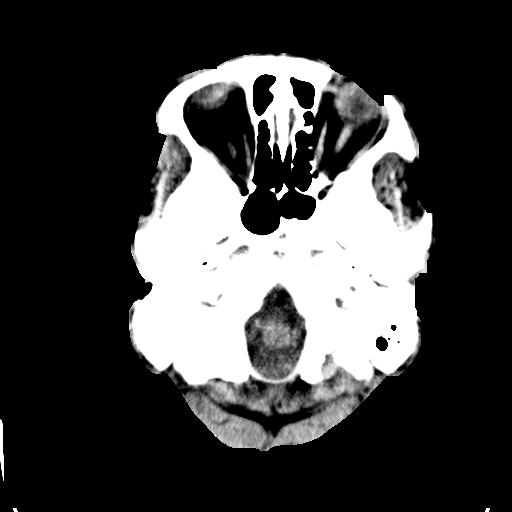
[im 2/32  bone]
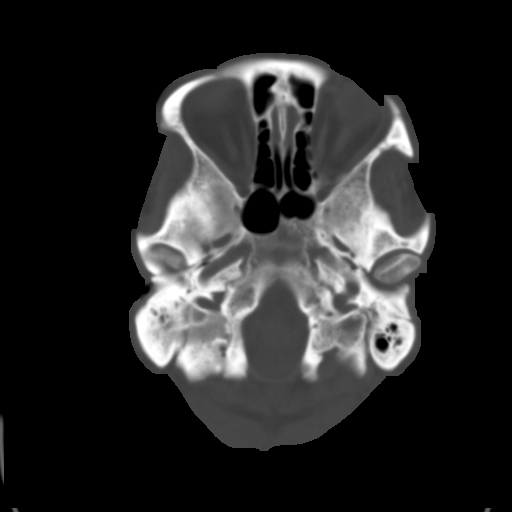
[im 4/32  brain]
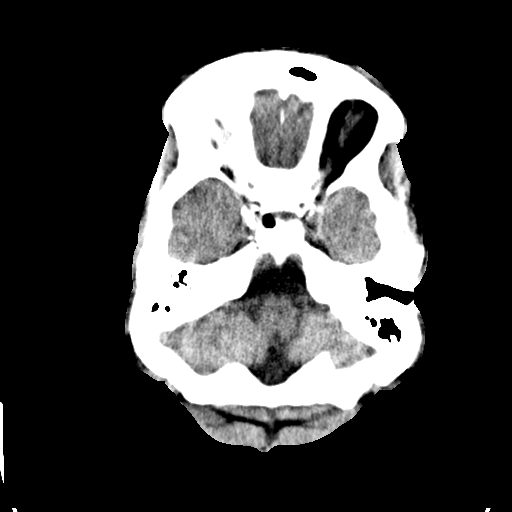
[im 6/32  brain]
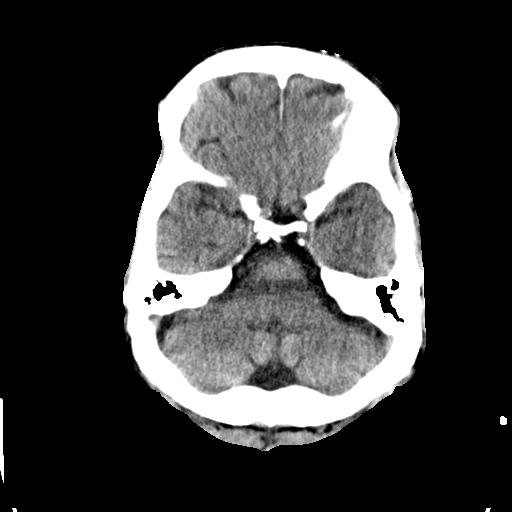
[im 8/32  brain]
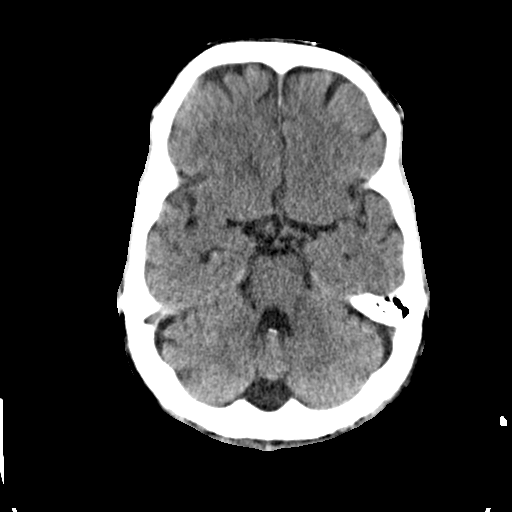
[im 10/32  brain]
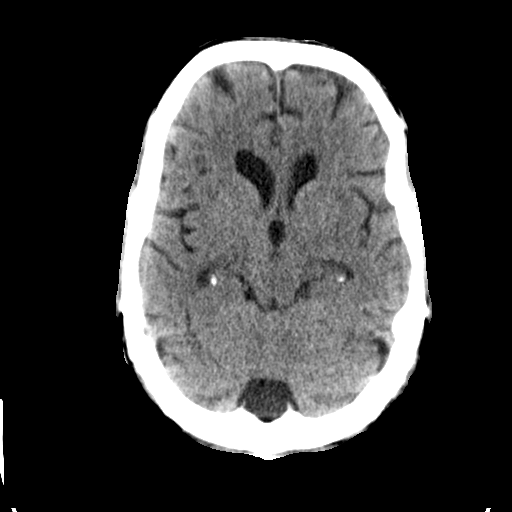
[im 10/32  bone]
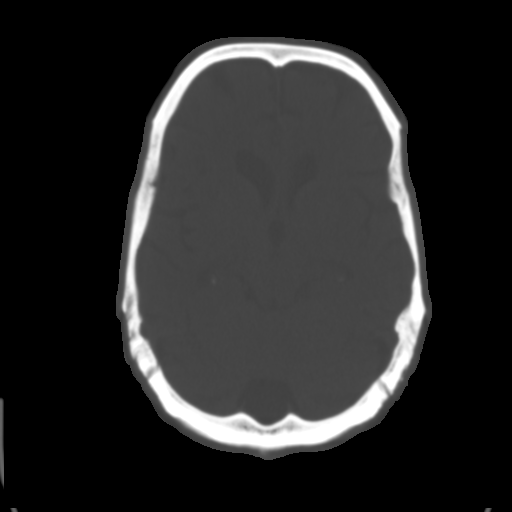
[im 12/32  brain]
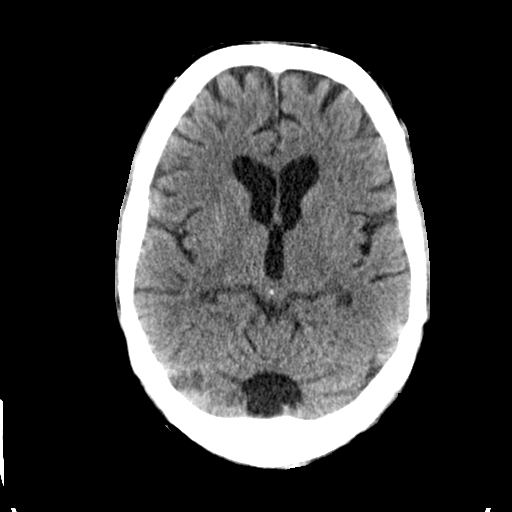
[im 14/32  brain]
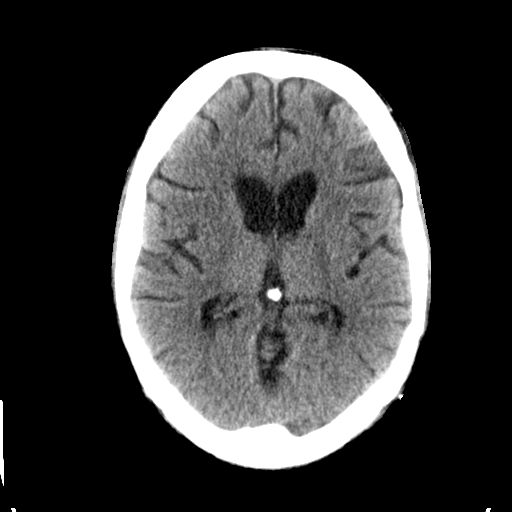
[im 17/32  brain]
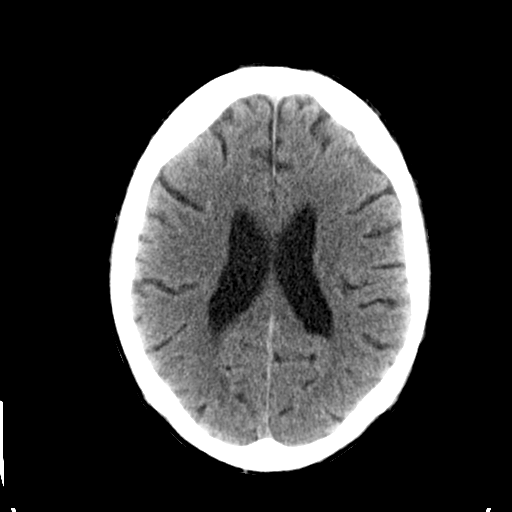
[im 18/32  brain]
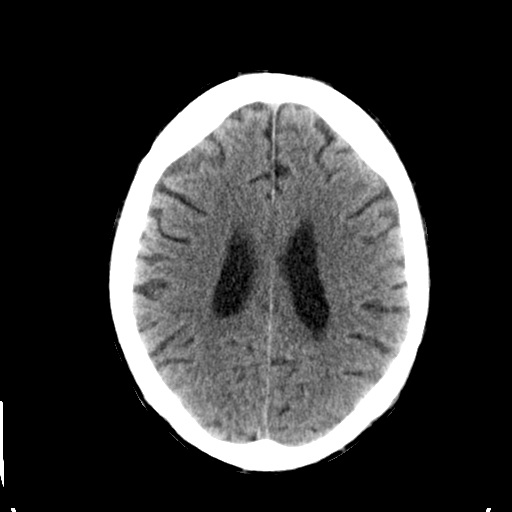
[im 18/32  bone]
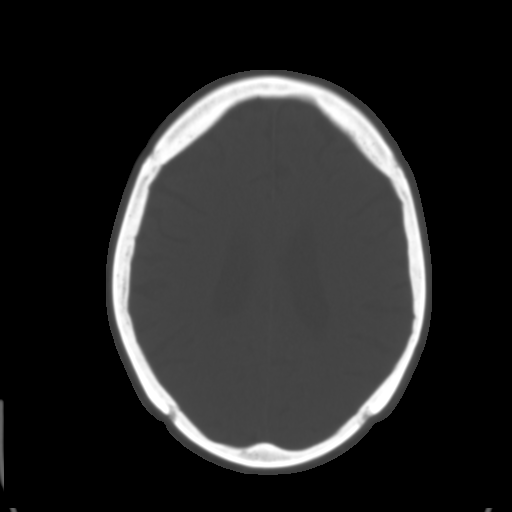
[im 20/32  brain]
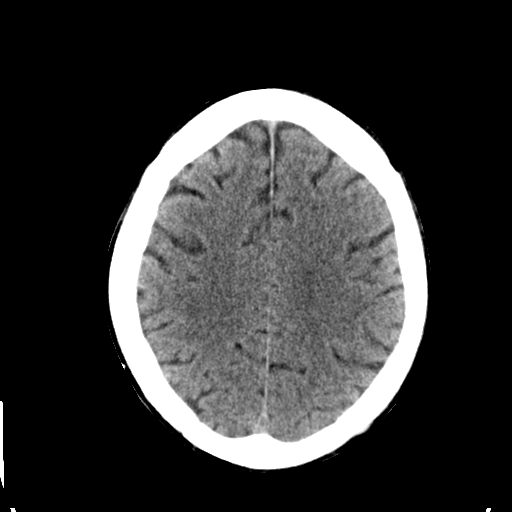
[im 22/32  brain]
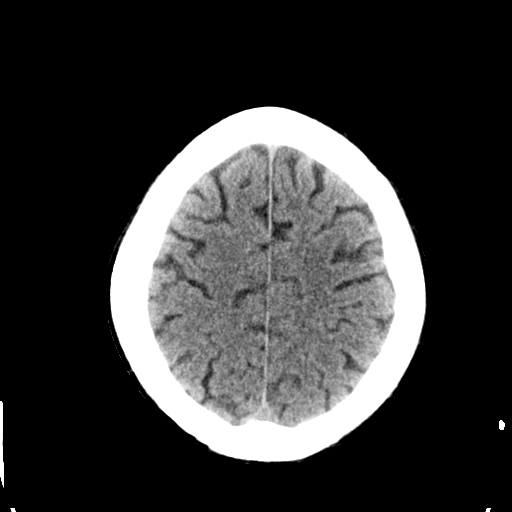
[im 24/32  brain]
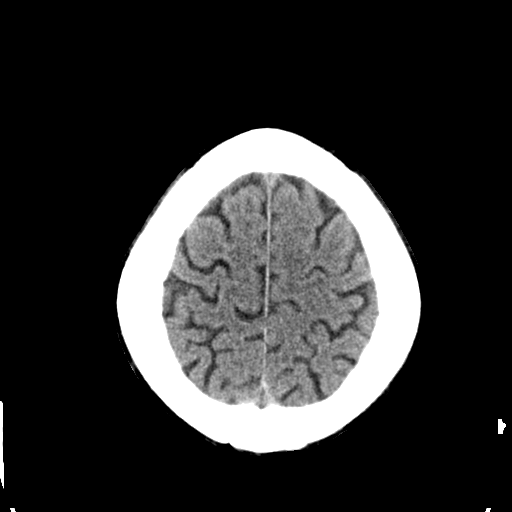
[im 26/32  brain]
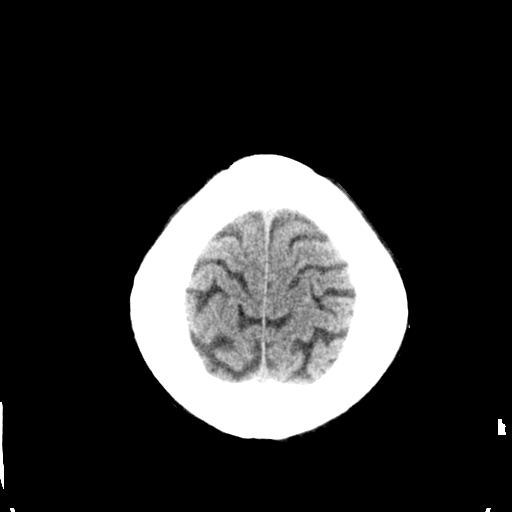
[im 26/32  bone]
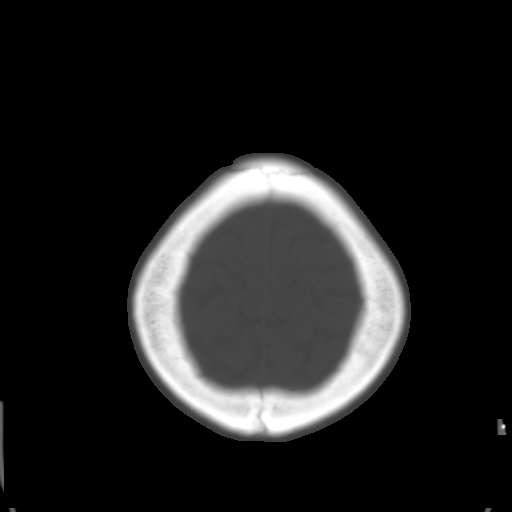
[im 28/32  brain]
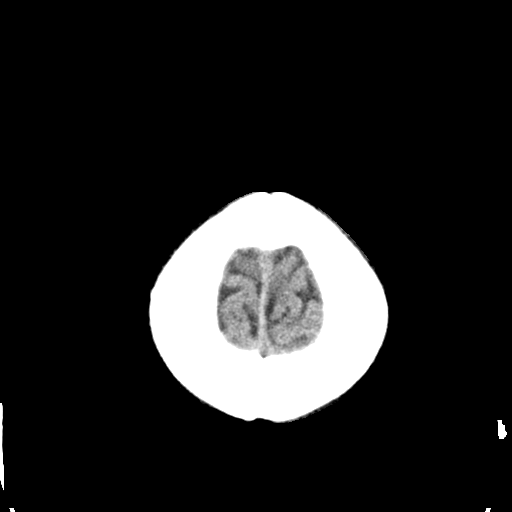
[im 30/32  brain]
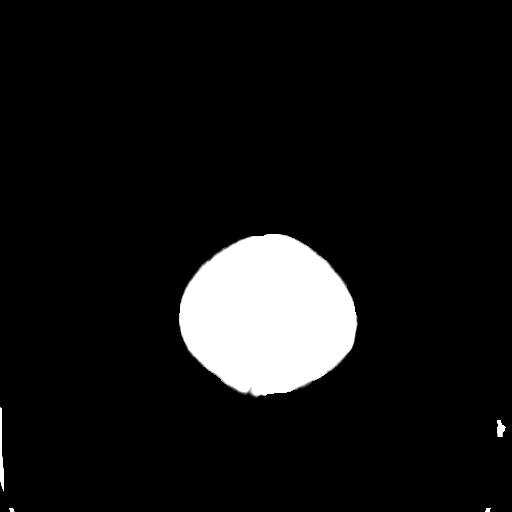

[15 of 30 positions shown; findings below may reference images not displayed]

FINDINGS: Ventricular system normal in size and appearance for age. Mild
age-appropriate cortical atrophy. Mega cisterna magna, an anatomic
variant. No mass lesion. No midline shift. No acute hemorrhage or
hematoma. No extra-axial fluid collections. No evidence of acute
infarction.

Very small right frontal scalp hematoma without underlying skull
fracture. Visualized paranasal sinuses, bilateral mastoid air cells
and bilateral middle ear cavities well-aerated. Approximate 1.3 cm
sebaceous cyst in the right frontal scalp near the midline. Moderate
bilateral carotid siphon atherosclerosis.
IMPRESSION: 1. No acute intracranial abnormality.
2. Mild age-appropriate cortical atrophy.
3. Very small right frontal scalp hematoma without underlying skull
fracture.

## 2017-09-30 IMAGING — US US ABDOMEN LIMITED
1 series · 14 of 25 positions shown · non-contrast
Comparison: CT of the abdomen pelvis dated 07/27/2015

CLINICAL DATA: Abnormal liver function tests.

EXAM:
US ABDOMEN LIMITED - RIGHT UPPER QUADRANT

[Series 1: us abdomen limited · 0.17mm/px · 14 of 85 slices shown]
[im 1/85]
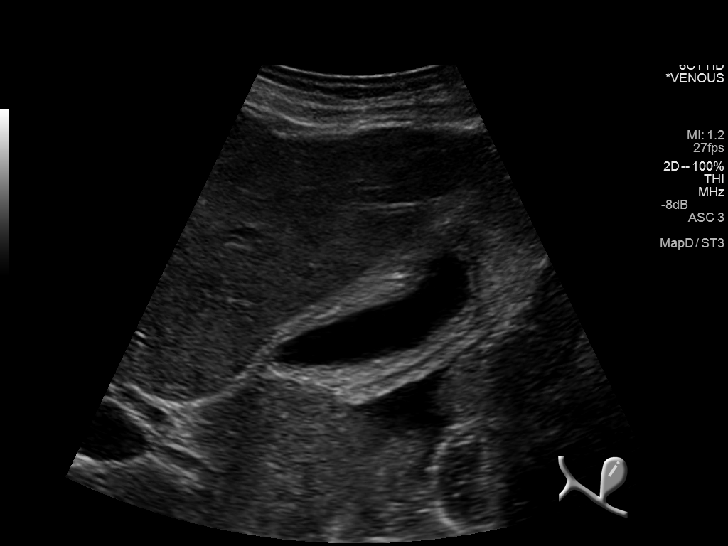
[im 8/85]
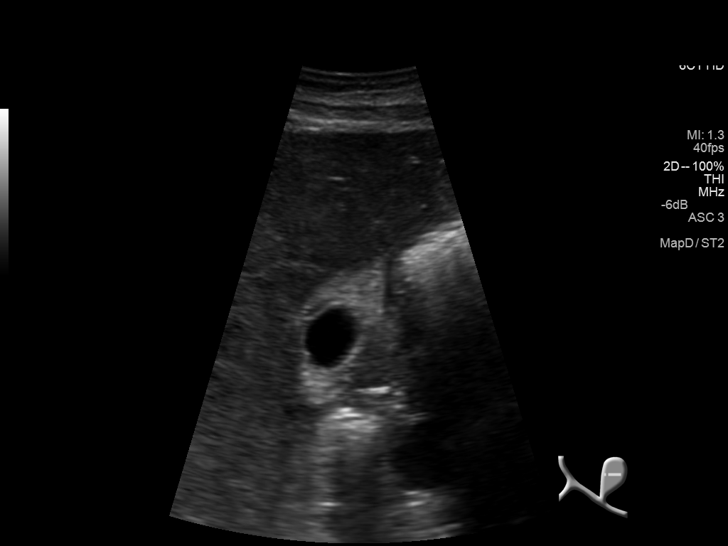
[im 15/85]
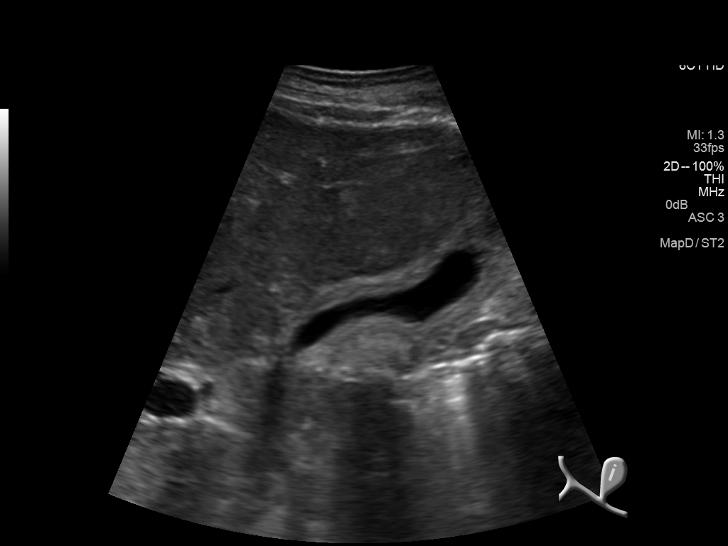
[im 22/85]
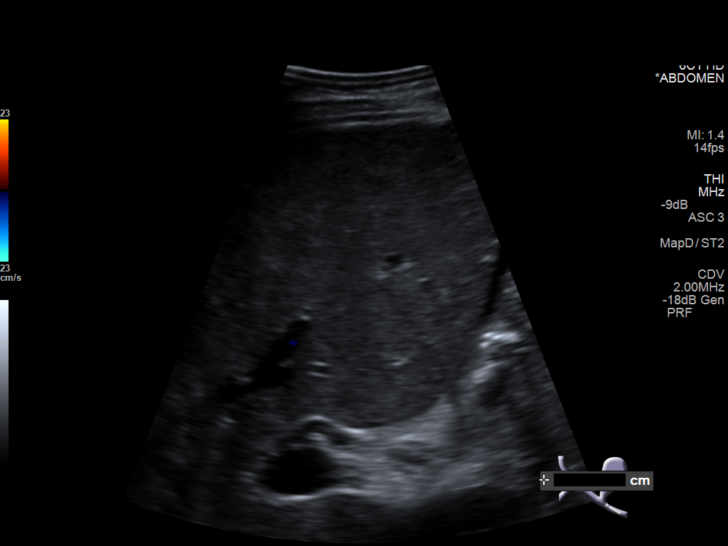
[im 29/85]
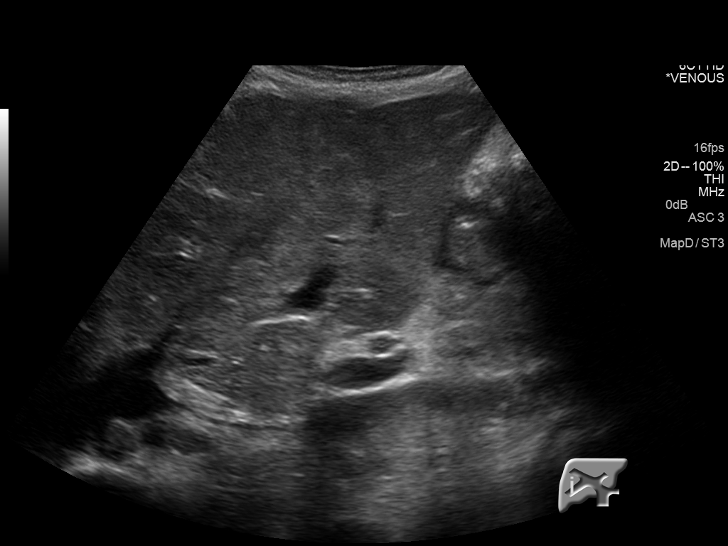
[im 32/85]
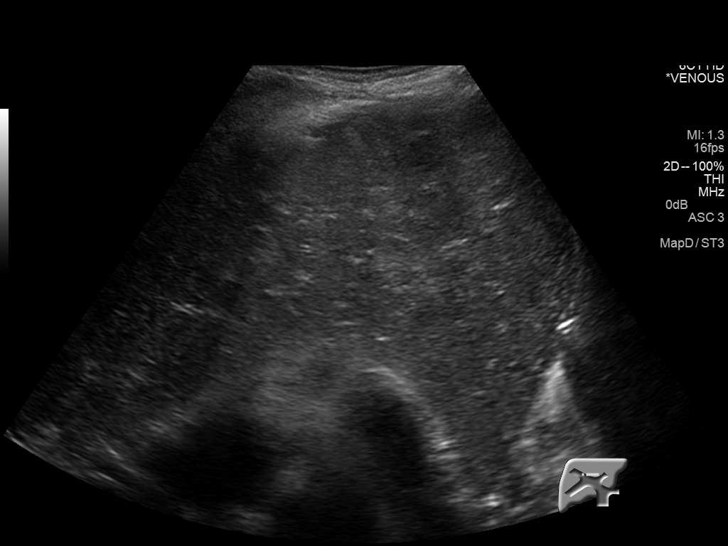
[im 39/85]
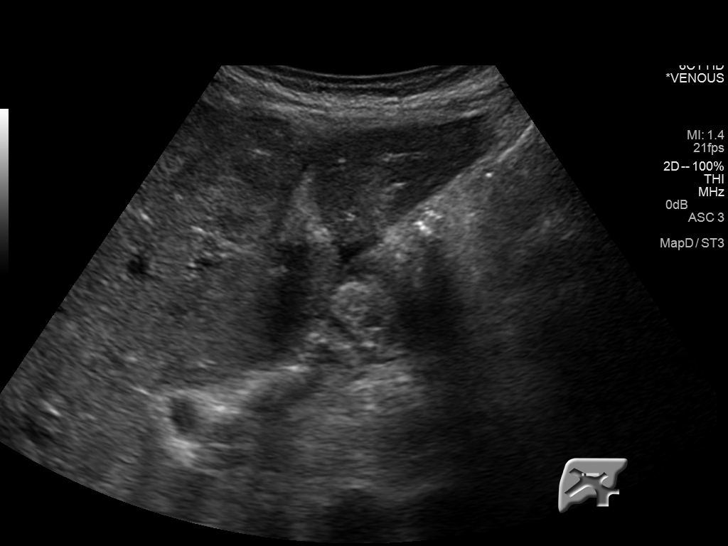
[im 46/85]
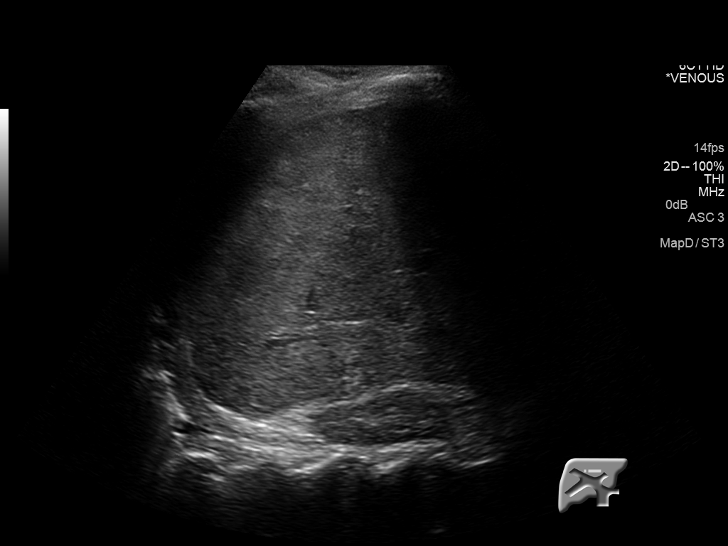
[im 53/85]
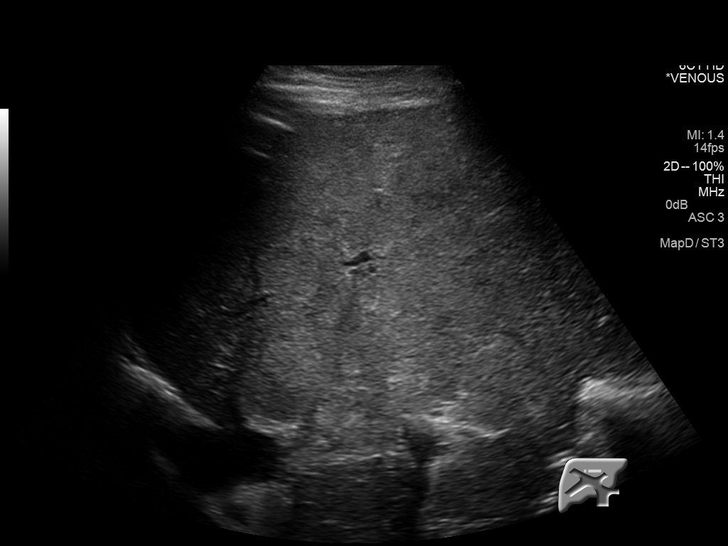
[im 57/85]
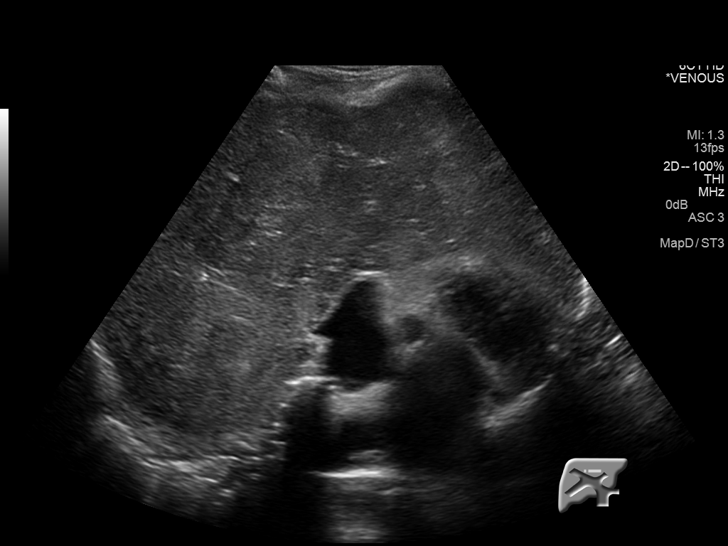
[im 64/85]
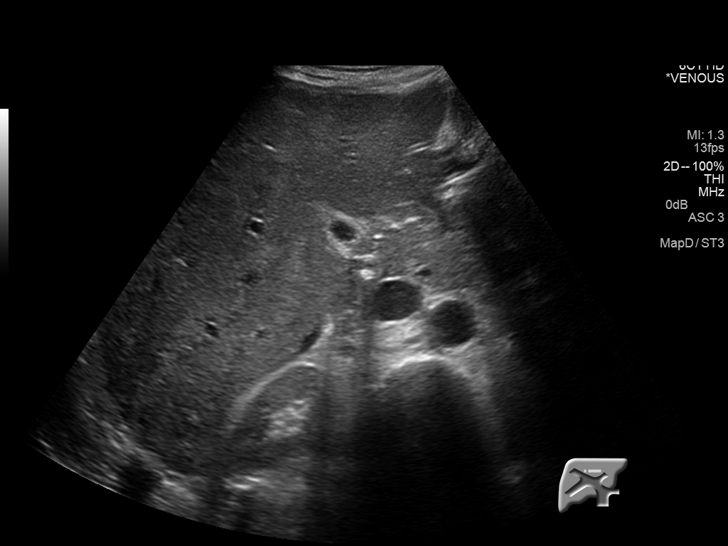
[im 71/85]
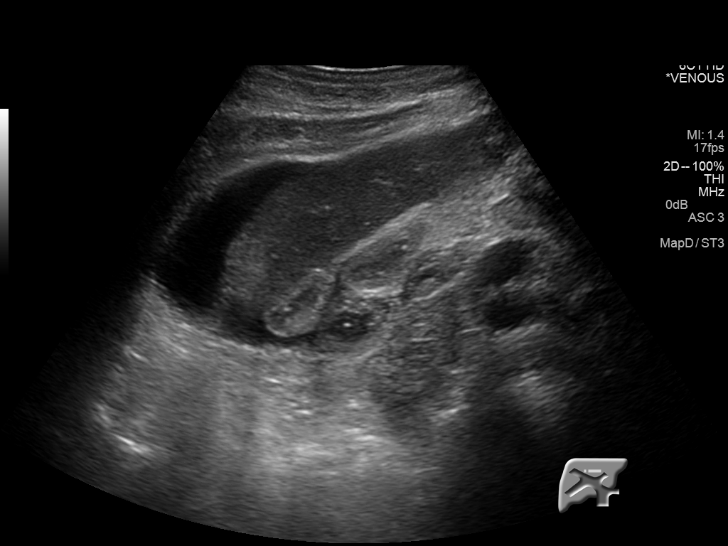
[im 78/85]
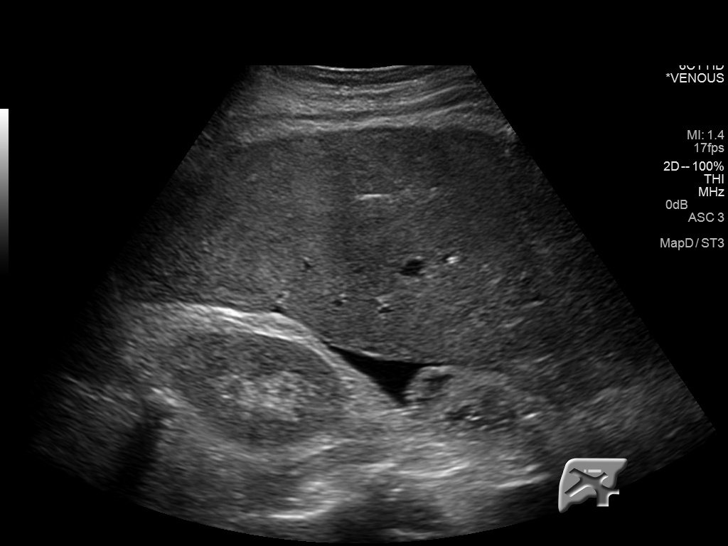
[im 85/85]
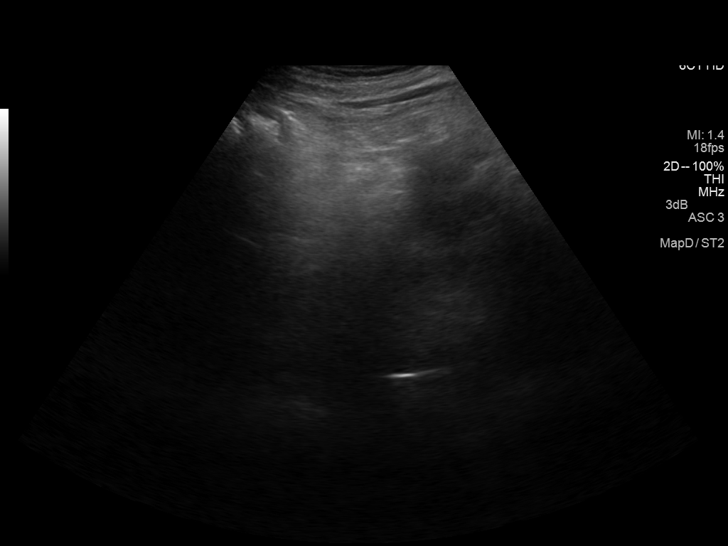

[14 of 25 positions shown; findings below may reference images not displayed]

FINDINGS: Gallbladder:

The patient is nonfasting. The gallbladder is incompletely
visualized due to its partially collapsed state. The gallbladder
wall is thickened to 4.7 mm, which may be artifactual. The
sonographic Murphy sign is negative.

Common bile duct:

Diameter: 3.4 mm.

Liver:

The liver demonstrate diffusely heterogeneous echogenicity, likely
due to known hepatic metastatic disease.

There is small amount of free fluid within the abdomen.
IMPRESSION: Gallbladder wall thickening may be due to incomplete distention, as
patient was not fasting at the time of the exam. Repeated imaging of
the gallbladder after 8-10 hours of fasting may be considered, if
acute cholecystitis is suspected.

Normal appearance of the common bile duct.

Diffusely heterogeneous echogenicity of the liver, likely due to
known metastatic disease.

Small amount of abdominal ascites.

## 2017-10-01 IMAGING — US US ABDOMEN COMPLETE
1 series · 14 of 25 positions shown · non-contrast
Comparison: CT scan of July 27, 2015.

CLINICAL DATA: Elevated liver function tests. Current history of
colon cancer.

EXAM:
ULTRASOUND ABDOMEN COMPLETE

[Series 1: us abdomen complete · 0.20mm/px · 14 of 88 slices shown]
[im 1/88]
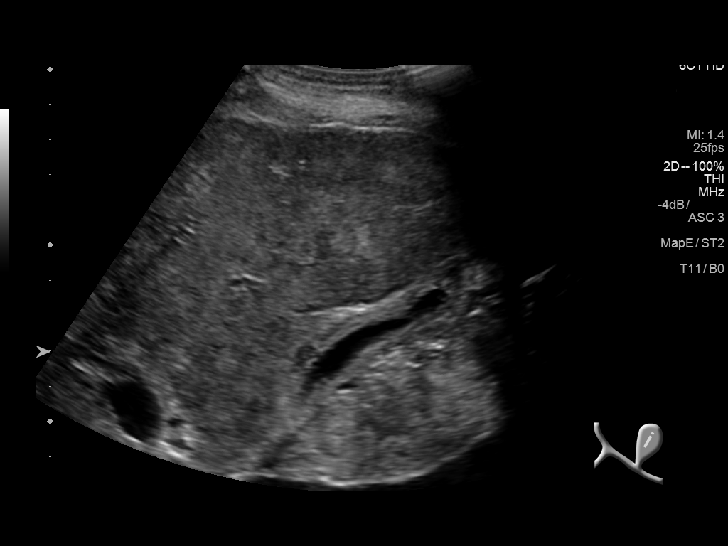
[im 8/88]
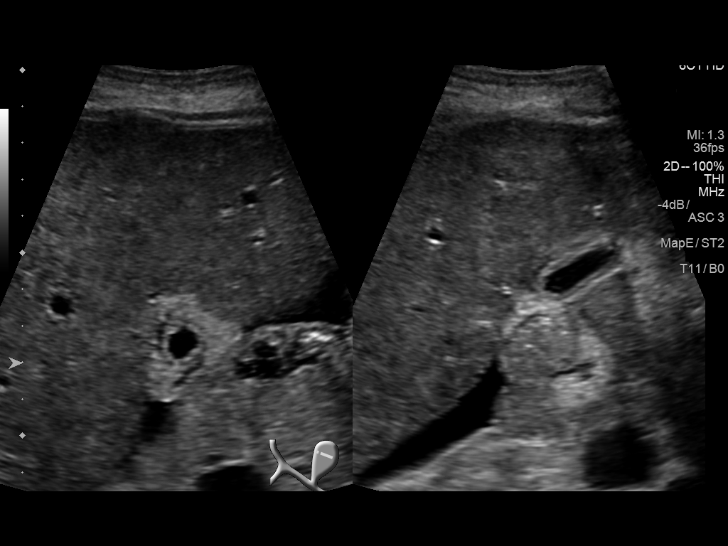
[im 15/88]
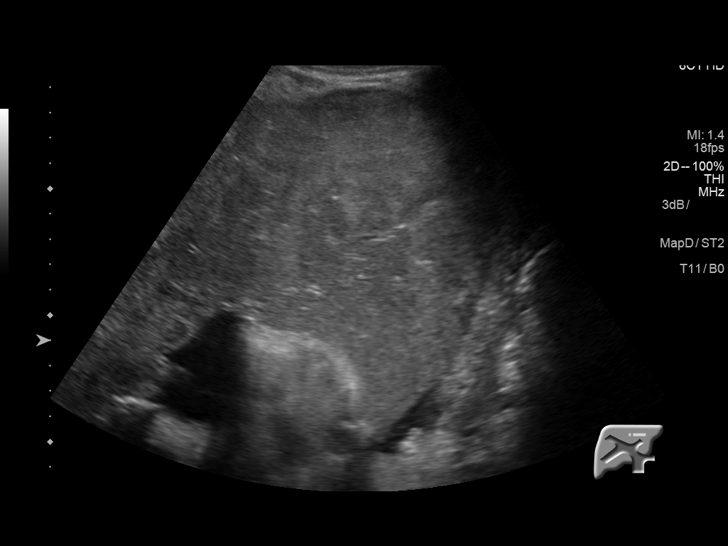
[im 22/88]
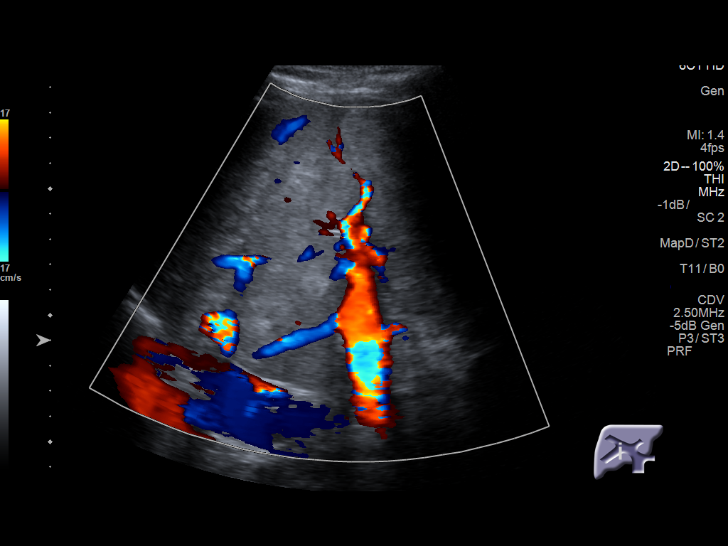
[im 30/88]
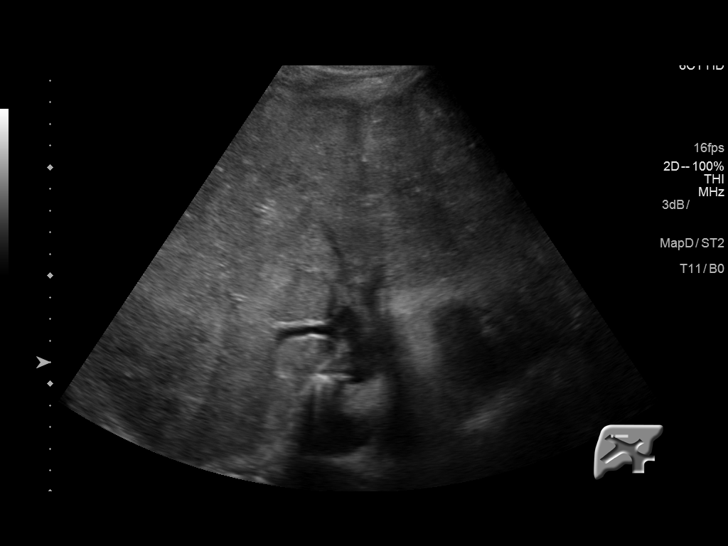
[im 33/88]
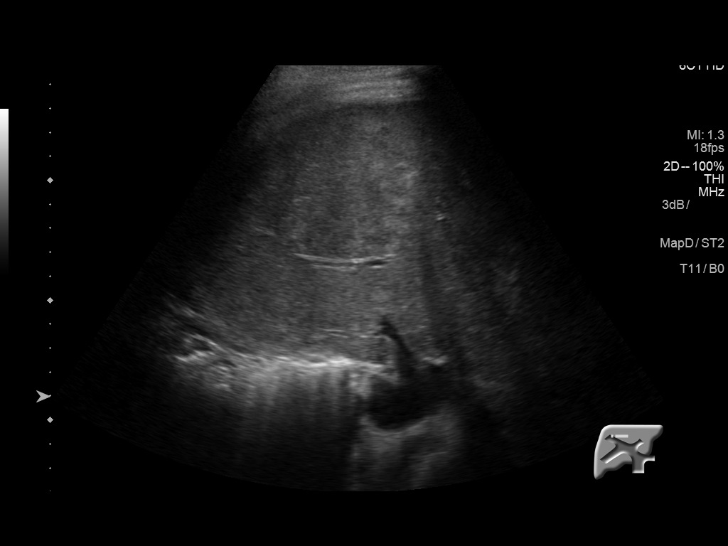
[im 40/88]
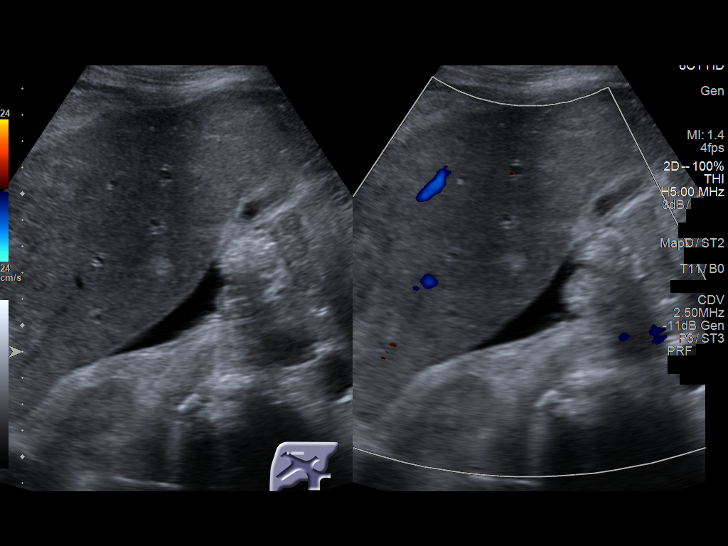
[im 48/88]
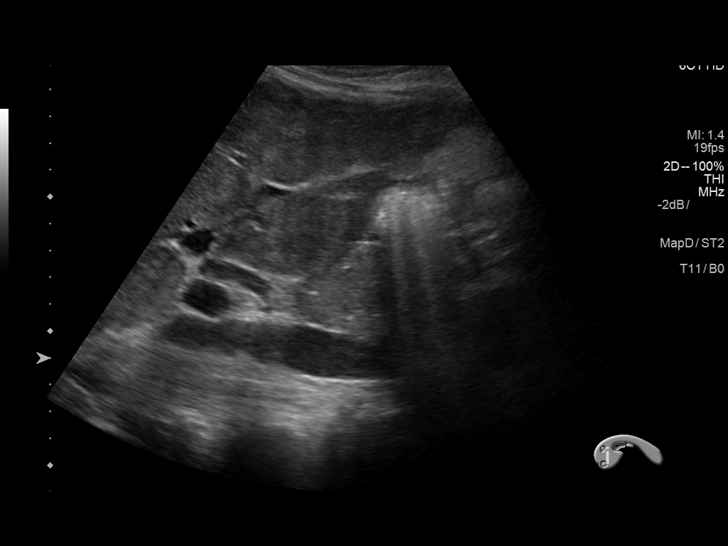
[im 55/88]
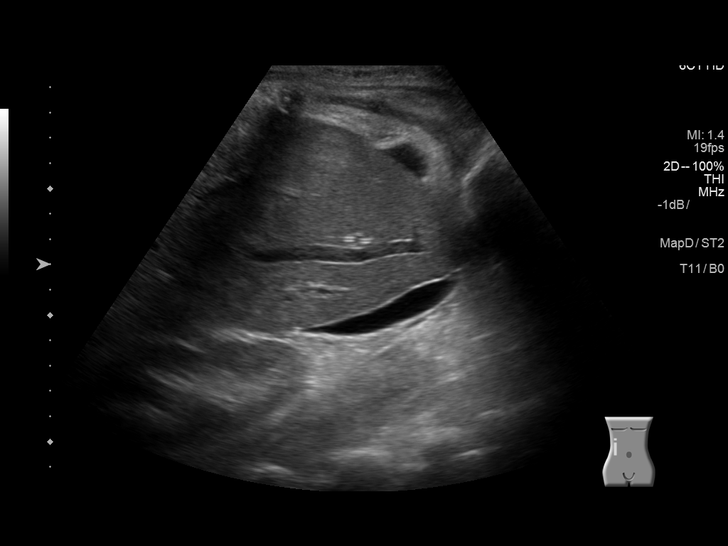
[im 59/88]
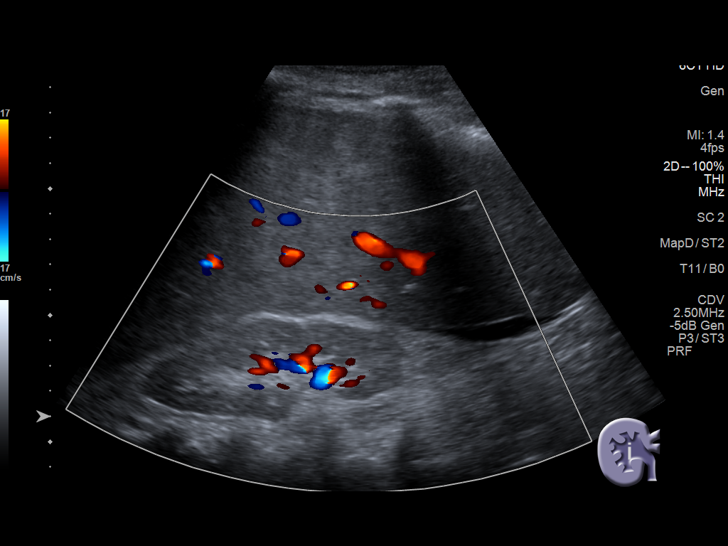
[im 66/88]
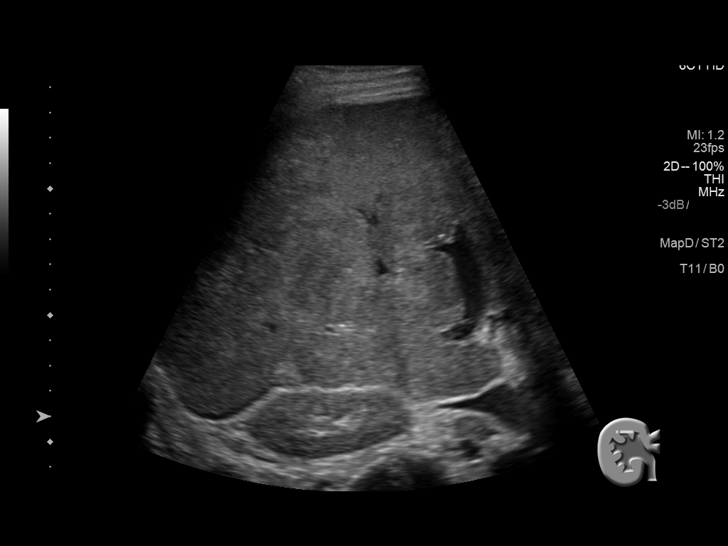
[im 73/88]
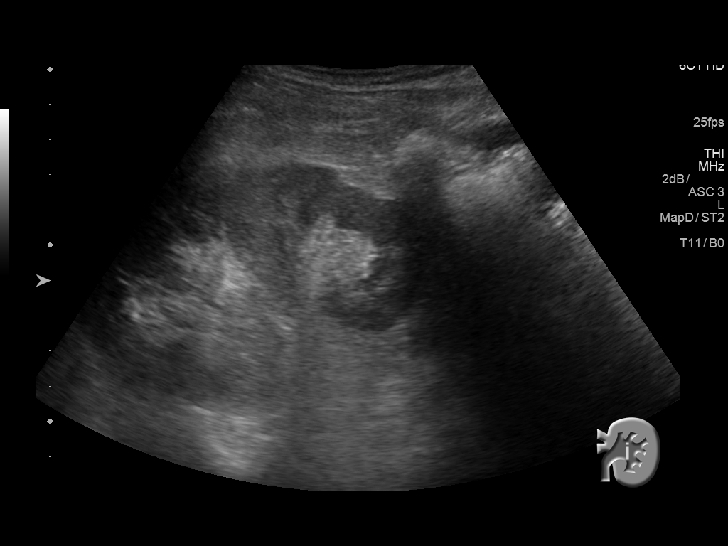
[im 80/88]
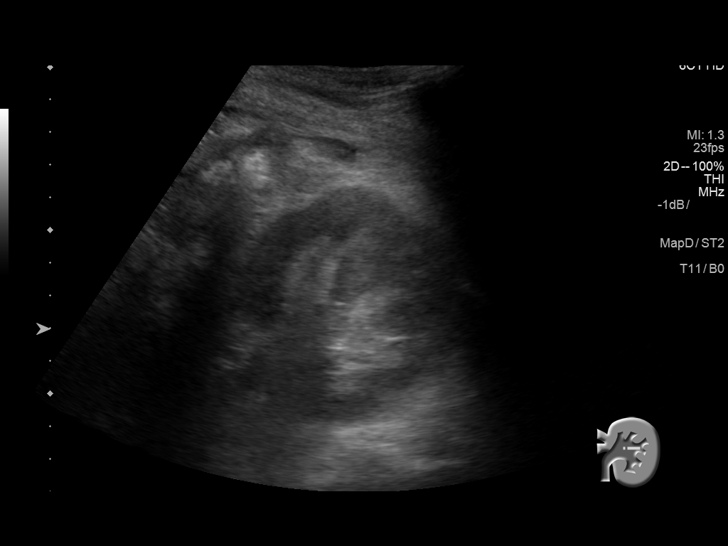
[im 88/88]
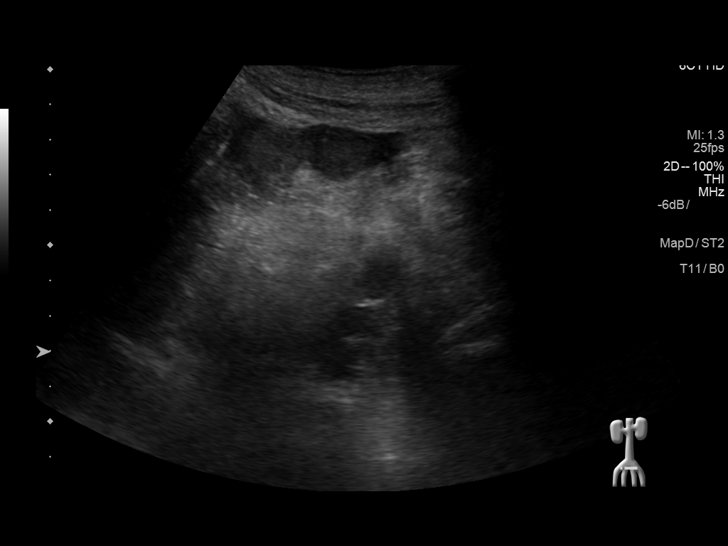

[14 of 25 positions shown; findings below may reference images not displayed]

FINDINGS: Gallbladder: Gallbladder is contracted. No gallstones or wall
thickening visualized. No sonographic Murphy sign noted.

Common bile duct: Diameter: 3.7 mm which is within normal limits.

Liver: Multiple rounded hyperdense foci are noted consistent with
metastatic disease as described on prior exam.

IVC: No abnormality visualized.

Pancreas: Visualized portion unremarkable.

Spleen: Size and appearance within normal limits.

Right Kidney: Length: 11 cm. Echogenicity within normal limits. No
mass or hydronephrosis visualized.

Left Kidney: Length: 10 cm. Echogenicity within normal limits. No
mass or hydronephrosis visualized.

Abdominal aorta: No aneurysm visualized.

Other findings: Minimal fluid is noted around the liver.
IMPRESSION: Findings consistent with multiple hepatic metastases. No biliary
dilatation or cholelithiasis is noted.
